# Patient Record
Sex: Male | Born: 1975
Health system: Southern US, Community
[De-identification: ages and names within clinical notes are randomized; demographics above are authoritative.]

## PROBLEM LIST (undated history)

## (undated) DIAGNOSIS — N189 Chronic kidney disease, unspecified: Secondary | ICD-10-CM

## (undated) DIAGNOSIS — T39395A Adverse effect of other nonsteroidal anti-inflammatory drugs [NSAID], initial encounter: Secondary | ICD-10-CM

## (undated) DIAGNOSIS — K2960 Other gastritis without bleeding: Secondary | ICD-10-CM

## (undated) DIAGNOSIS — M1990 Unspecified osteoarthritis, unspecified site: Secondary | ICD-10-CM

## (undated) DIAGNOSIS — K219 Gastro-esophageal reflux disease without esophagitis: Secondary | ICD-10-CM

## (undated) HISTORY — DX: Unspecified osteoarthritis, unspecified site: M19.90

## (undated) HISTORY — DX: Other gastritis without bleeding: K29.60

## (undated) HISTORY — DX: Chronic kidney disease, unspecified: N18.9

## (undated) HISTORY — DX: Gastro-esophageal reflux disease without esophagitis: K21.9

## (undated) HISTORY — DX: Adverse effect of other nonsteroidal anti-inflammatory drugs (NSAID), initial encounter: T39.395A

---

## 2000-03-16 ENCOUNTER — Emergency Department (HOSPITAL_COMMUNITY): Admission: EM | Admit: 2000-03-16 | Discharge: 2000-03-16 | Payer: Self-pay | Admitting: *Deleted

## 2000-03-16 ENCOUNTER — Encounter: Payer: Self-pay | Admitting: Emergency Medicine

## 2008-09-26 ENCOUNTER — Encounter: Admission: RE | Admit: 2008-09-26 | Discharge: 2008-09-26 | Payer: Self-pay | Admitting: Family Medicine

## 2010-04-06 HISTORY — PX: KNEE ARTHROSCOPY: SHX127

## 2011-05-09 ENCOUNTER — Ambulatory Visit (INDEPENDENT_AMBULATORY_CARE_PROVIDER_SITE_OTHER): Payer: BC Managed Care – PPO | Admitting: Family Medicine

## 2011-05-09 VITALS — BP 110/80 | HR 62 | Temp 98.5°F | Resp 16 | Ht 66.75 in | Wt 185.2 lb

## 2011-05-09 DIAGNOSIS — M545 Low back pain, unspecified: Secondary | ICD-10-CM

## 2011-05-09 DIAGNOSIS — K644 Residual hemorrhoidal skin tags: Secondary | ICD-10-CM

## 2011-05-09 MED ORDER — NAPROXEN 500 MG PO TABS: 500.0000 mg | ORAL_TABLET | Freq: Two times a day (BID) | ORAL | Status: DC

## 2011-05-09 MED ORDER — HYDROCORTISONE 2.5 % RE CREA: TOPICAL_CREAM | Freq: Two times a day (BID) | RECTAL | Status: AC

## 2011-05-09 MED ORDER — FLUTICASONE PROPIONATE 50 MCG/ACT NA SUSP: 2.0000 | Freq: Every day | NASAL | Status: DC

## 2011-05-09 NOTE — Progress Notes (Signed)
  Subjective:    Patient ID: Ryan Robles, male    DOB: 1975/12/25, 36 y.o.   MRN: [id number]  HPI  36 yo male with c/o 1) back pain- - low back pain, especially in the morning or carrying something heavy or bending over.  Off and on for months but worse last two weeks.  Advil helps.  No heat or ice.   2) hemorrhoids- history several years ago.  Having occassional pain.  Yesterday slight bleeding.  Constipation prior to symptoms.  A little diarrhea a few days ago.  Stopped.   Snores a lot also - congested.  Would like sometihng to help.  Bothers wife very much  Review of Systems    negative except as per HPI Objective:   Physical Exam  Constitutional: He appears well-developed and well-nourished.  HENT:  Nose: Mucosal edema present.  Genitourinary: Rectal exam shows external hemorrhoid.  Musculoskeletal: Normal range of motion.       Lumbar back: He exhibits no tenderness, no bony tenderness and no spasm.          Assessment & Plan:  Hemorrhoids - hydrocortisone cream.  Colace to help with BM's.  Handout given  LBP - muscular, mild - naproxen.  Snoring - try flonase.

## 2011-05-09 NOTE — Patient Instructions (Signed)
Hemorroides  (Hemorrhoids) Las hemorroides son venas agrandadas (dilatadas) alrededor del recto. Hay 2 tipos de hemorroides, que se determina por su ubicacin. Las hemorroides internas, que son las que se encuentran dentro del recto.Generalmente no duelen, Charity fundraiser.Sin embargo, pueden hincharse y salir por el recto, entonces se irritan y duelen. Las hemorroides externas incluyen las venas externas del ano, y se sienten como un bulto duro y que duele, cerca del ano.Muchas veces pican, y pueden romperse y Geophysicist/field seismologist. En algunos casos se forman cogulos en las venas. Esto hace que se hinchen y duelan. Esto se suele denominar trombosis hemorroidal.  CAUSAS  Las causas de hemorroides son:   Vanetta Mulders. El embarazo aumenta la presin en las venas hemorroidales.   Constipacin   Dificultad para mover el intestino   Obesidad.   Levantar pesas u otras actividades que impliquen esfuerzo.  TRATAMIENTO  La mayora de los casos de hemorroides mejoran en 1 a 2 semanas. Sin embargo, si los sntomas no mejoran o tiene Runner, broadcasting/film/video, el mdico puede Education officer, environmental un procedimiento para disminuir las hemorroides o extirparlas completamente.Los tratamientos posibles son:   Abigail Butts con Neomia Dear banda de goma. Se coloca una banda de goma en la base de la hemorroides para cortar la circulacin.   Escleroterapia Se inyecta una sustancia qumica para disminuir el tamao de la hemorroides.   Terapia con luz infrarroja. Se utiliza un instrumento para quemar la hemorroides.   Hemorroidectoma Es la remocin quirrgica de la hemorroides.  INSTRUCCIONES PARA EL CUIDADO EN EL HOGAR   Agregue fibra a su dieta. Consulte con su mdico acerca del uso de suplementos con fibras.   Beba gran cantidad de lquido para mantener la orina de tono claro o color amarillo plido.   Haga ejercicios regularmente.   Vaya al bao cuando sienta la necesidad de mover el intestino. Noespere.   Evite hacer fuerza al mover el  intestino.   Mantenga la zona anal limpia y seca.   Solo tome medicamentos que se pueden comprar sin receta o recetados para Chief Technology Officer, Dentist o fiebre, como le indica el mdico.  Si la hemorroides est trombosada:   Tome baos de asiento calientes durante 20 a 30 minutos, 3 a 4 veces por da.   Si le duele y est hinchada, coloque compresas con hielo en la zona, segn la tolerancia. Usar las compresas de [employer] baos de asiento puede ser Cresco. Llene una bolsa plstica con hielo. Coloque una toalla entre la bolsa de hielo y la piel.   Puede usar o Contractor segn las indicaciones algunas cremas especiales y supositorios.   No utilice una almohada en forma de aro ni se siente en el inodoro durante perodos prolongados. Esto aumenta la afluencia de sangre y Chief Technology Officer.  SOLICITE ATENCIN MDICA SI:   Aumenta el dolor y la hinchazn, y no puede controlarlo con Tourist information centre manager.   Tiene un sangrado que no puede parar.   No puede mover el intestino.   Siente dolor o tiene inflamacin fuera de la zona de las hemorroides.   Tiene escalofros o una temperatura oral mayor a 102 F (38.9 C).  ASEGRESE DE QUE:   Comprende estas instrucciones.   Controlar su enfermedad.   Solicitar ayuda de inmediato si no mejora o si empeora.  Document Released: 03/23/2005 Document Revised: 12/03/2010 [facility] Patient Information 2012 [place], [place].

## 2011-07-09 ENCOUNTER — Ambulatory Visit (INDEPENDENT_AMBULATORY_CARE_PROVIDER_SITE_OTHER): Payer: BC Managed Care – PPO | Admitting: Family Medicine

## 2011-07-09 VITALS — BP 142/68 | HR 104 | Temp 102.6°F | Resp 16 | Ht 67.0 in | Wt 178.0 lb

## 2011-07-09 DIAGNOSIS — R059 Cough, unspecified: Secondary | ICD-10-CM

## 2011-07-09 DIAGNOSIS — R05 Cough: Secondary | ICD-10-CM

## 2011-07-09 DIAGNOSIS — R509 Fever, unspecified: Secondary | ICD-10-CM

## 2011-07-09 LAB — POCT INFLUENZA A/B
Influenza A, POC: NEGATIVE
Influenza B, POC: NEGATIVE

## 2011-07-09 MED ORDER — IBUPROFEN 200 MG PO TABS
400.0000 mg | ORAL_TABLET | Freq: Once | ORAL | Status: AC
  Administered 2011-07-09: 400 mg via ORAL

## 2011-07-09 MED ORDER — NAPROXEN 500 MG PO TABS: 500.0000 mg | ORAL_TABLET | Freq: Two times a day (BID) | ORAL | Status: DC

## 2011-07-09 MED ORDER — HYDROCODONE-HOMATROPINE 5-1.5 MG/5ML PO SYRP: 5.0000 mL | ORAL_SOLUTION | Freq: Three times a day (TID) | ORAL | Status: AC | PRN

## 2011-07-09 NOTE — Progress Notes (Signed)
  Subjective:    Patient ID: Ryan Robles, male    DOB: December 07, 1975, 36 y.o.   MRN: [id number]  HPI 36 yo male with flu like symptoms for 2 days. Cough, sore throat, runny nose, bodyaches.  Couldn't stay at work.  Acutely worse today.  Daughter recently ill with similar symptoms.     Review of Systems Negative except as per HPI     Objective:   Physical Exam  Vitals reviewed. Constitutional: He appears well-developed. No distress.  HENT:  Right Ear: Tympanic membrane, external ear and ear canal normal. Tympanic membrane is not injected, not scarred, not perforated, not erythematous, not retracted and not bulging.  Left Ear: Tympanic membrane, external ear and ear canal normal. Tympanic membrane is not injected, not scarred, not perforated, not erythematous, not retracted and not bulging.  Nose: No mucosal edema or rhinorrhea. Right sinus exhibits no maxillary sinus tenderness and no frontal sinus tenderness. Left sinus exhibits no maxillary sinus tenderness and no frontal sinus tenderness.  Mouth/Throat: Uvula is midline, oropharynx is clear and moist and mucous membranes are normal. No oropharyngeal exudate or tonsillar abscesses.  Cardiovascular: Normal rate, regular rhythm, normal heart sounds and intact distal pulses.   No murmur heard. Pulmonary/Chest: Effort normal and breath sounds normal. No respiratory distress. He has no wheezes. He has no rales.  Lymphadenopathy:       Head (right side): No submandibular and no preauricular adenopathy present.       Head (left side): No submandibular and no preauricular adenopathy present.       Right cervical: No superficial cervical and no posterior cervical adenopathy present.      Left cervical: No superficial cervical and no posterior cervical adenopathy present.       Right: No supraclavicular adenopathy present.       Left: No supraclavicular adenopathy present.  Skin: Skin is warm and dry.   Frequent coughing fits.  Results for  orders placed in visit on 07/09/11  POCT INFLUENZA A/B      Component Value Range   Influenza A, POC Negative     Influenza B, POC Negative          Assessment & Plan:  Fever, cough - flu negative.  Naproxen and hycodan.  Rest, fluids.  OOW tomorrow.

## 2011-12-17 ENCOUNTER — Ambulatory Visit (INDEPENDENT_AMBULATORY_CARE_PROVIDER_SITE_OTHER): Payer: BC Managed Care – PPO | Admitting: Family Medicine

## 2011-12-17 VITALS — BP 116/78 | HR 60 | Temp 98.3°F | Resp 16 | Ht 66.5 in | Wt 171.0 lb

## 2011-12-17 DIAGNOSIS — Z23 Encounter for immunization: Secondary | ICD-10-CM

## 2011-12-17 DIAGNOSIS — Z0000 Encounter for general adult medical examination without abnormal findings: Secondary | ICD-10-CM

## 2011-12-17 LAB — COMPREHENSIVE METABOLIC PANEL
Albumin: 5.1 g/dL (ref 3.5–5.2)
CO2: 28 mEq/L (ref 19–32)
Glucose, Bld: 87 mg/dL (ref 70–99)
Potassium: 4.1 mEq/L (ref 3.5–5.3)
Sodium: 134 mEq/L — ABNORMAL LOW (ref 135–145)
Total Bilirubin: 1 mg/dL (ref 0.3–1.2)
Total Protein: 8 g/dL (ref 6.0–8.3)

## 2011-12-17 LAB — LIPID PANEL
Cholesterol: 184 mg/dL (ref 0–200)
Triglycerides: 95 mg/dL (ref <150)

## 2011-12-17 LAB — POCT CBC
HCT, POC: 47.8 % (ref 43.5–53.7)
Hemoglobin: 15 g/dL (ref 14.1–18.1)
Lymph, poc: 1.8 (ref 0.6–3.4)
MCHC: 31.4 g/dL — AB (ref 31.8–35.4)
POC Granulocyte: 5 (ref 2–6.9)

## 2011-12-17 NOTE — Patient Instructions (Addendum)
Fascitis plantar (Plantar Fascitis) La fascitis plantar es un trastorno frecuente que ocasiona dolor en el pie. Se trata de una inflamacin (irritacin) de la banda de tejidos fibrosos y resistentes que se extienden desde el hueso del taln (calcneo) hasta la parte anterior de la planta del pie. Esta inflamacin puede deberse a que ha permanecido Intel, a zapatos que no Holiday representative, a correr [employer], al sobrepeso, a un andar anormal y el uso excesivo del pie con dolor (esto es frecuente en los corredores). Tambin es frecuente [employer] que practican ejercicios aerbicos y Snowflake bailarines de ballet. SNTOMAS La persona que sufre fascitis plantar manifiesta:  Dolor intenso por la Assurant parte inferior del pie, especialmente al dar los primeros pasos luego de levantarse de la cama. Este dolor disminuye luego de caminar algunos minutos.   Dolor intenso al caminar [employer] de un tiempo prolongado de inactividad.   El dolor empeora al caminar descalzo o al subir escaleras.  DIAGNSTICO  El mdico har el diagnstico examinando sus pies.   En general no es necesario indicar radiografas.  PREVENCIN  Consulte a Nurse, mental health en medicina del deporte antes de comenzar un nuevo programa de ejercicios.   Los programas de caminatas ofrecen un buen entrenamiento. Hay una menor probabilidad de sufrir lesiones por uso excesivo, que son frecuentes [place] personas que corren. Hay menos impacto y menos agresin a las articulaciones.   Comience lentamente todo nuevo programa de ejercicios. Si aparece algn problema o dolor, disminuya la cantidad de tiempo o la distancia hasta que se encuentre cmodo.   Use calzado de buena calidad y reemplcelo regularmente.   Estire el pie y los ligamentos que se encuentran en la parte posterior del tobillo (tendn de [place]) antes y despus de Education officer, environmental actividad fsica.   Corra o practique ejercicios sobre  superficies parejas que no sean duras. Por ejemplo, el asfalto es mejor que el pavimento.   No corra descalzo sobre superficies duras.   Si camina sobre cinta, vare la inclinacin.   No siga con el entrenamiento si tiene problemas en el pie o en la articulacin. Busque ayuda profesional si no mejora.  INSTRUCCIONES PARA EL CUIDADO DOMICILIARIO  Evite las SUPERVAL[employer] INC causan dolor hasta que se recupere.   Use hielo o compresas fras sobre las zonas doloridas despus de Education officer, environmental ejercicios.   Only take over-the-counter or prescription medicines for pain, discomfort, or fever as directed by your caregiver.   Las plantillas blandas o las zapatillas con base de aire o gel pueden ser de [place].   Si los problemas continan o se agravan, consulte a Music therapist en medicina del deporte o con su mdico personal. La cortisona es un potente antiinflamatorio que puede inyectarse en la zona dolorida. El profesional que lo asiste comentar este tratamiento con usted.  EST SEGURO QUE:   Comprende las instrucciones para el alta mdica.   Controlar su enfermedad.   Solicitar atencin mdica de inmediato segn las indicaciones.  Document Released: 12/31/2004 Document Revised: 03/12/2011 [facility] Patient Information 2012 [place], [place].

## 2011-12-17 NOTE — Progress Notes (Signed)
[facility] [address] [place] [address] [phone]  Date:  12/17/2011   Name:  Ryan Robles   DOB:  1976-02-21   MRN:  [id number]  PCP:  No primary provider on file.    Chief Complaint: Annual Exam   History of Present Illness:  Ryan Robles is a 36 y.o. very pleasant male patient who presents with the following:  Here today for a CPE and biometric screening for his employer.  He is generally healthy.  Did have knee surgery (right knee scope) about a year ago and still has some pain with walking.    He also notes that when he works very hard he sometimes feels lightheaded and sweats a lot/ feels tired.    Fasting this morning.  He is unsure of the exact date of his last tetanus shot, but thinks he got it along with his knee surgery.   He would like to get a flu shot today as well  There is no problem list on file for this patient.   No past medical history on file.  No past surgical history on file.  History  Substance Use Topics  . Smoking status: Never Smoker   . Smokeless tobacco: Not on file  . Alcohol Use: Not on file    No family history on file.  No Known Allergies  Medication list has been reviewed and updated.  Current Outpatient Prescriptions on File Prior to Visit  Medication Sig Dispense Refill  . ibuprofen (ADVIL,MOTRIN) 200 MG tablet Take 200 mg by mouth 2 (two) times daily as needed.        Review of Systems:  As per HPI- otherwise negative.   Physical Examination: Filed Vitals:   12/17/11 0808  BP: 116/78  Pulse: 60  Temp: 98.3 F (36.8 C)  Resp: 16   Filed Vitals:   12/17/11 0808  Height: 5' 6.5" (1.689 m)  Weight: 171 lb (77.565 kg)   Body mass index is 27.19 kg/(m^2). Ideal Body Weight: Weight in (lb) to have BMI = 25: 156.9   GEN: WDWN, NAD, Non-toxic, A & O x 3 HEENT: Atraumatic, Normocephalic. Neck supple. No masses, No LAD.  TM and oropharynx wnl, PEERL, EOMI.   Ears and Nose: No external  deformity. CV: RRR, No M/G/R. No JVD. No thrill. No extra heart sounds. PULM: CTA B, no wheezes, crackles, rhonchi. No retractions. No resp. distress. No accessory muscle use. ABD: S, NT, ND, +BS. No rebound. No HSM. EXTR: No c/c/e NEURO Normal gait.  GU: normal exam PSYCH: Normally interactive. Conversant. Not depressed or anxious appearing.  Calm demeanor.   Results for orders placed in visit on 12/17/11  POCT CBC      Component Value Range   WBC 7.2  4.6 - 10.2 K/uL   Lymph, poc 1.8  0.6 - 3.4   POC LYMPH PERCENT 24.8  10 - 50 %L   MID (cbc) 0.5  0 - 0.9   POC MID % 6.3  0 - 12 %M   POC Granulocyte 5.0  2 - 6.9   Granulocyte percent 68.9  37 - 80 %G   RBC 5.03  4.69 - 6.13 M/uL   Hemoglobin 15.0  14.1 - 18.1 g/dL   HCT, POC 29.5  62.1 - 53.7 %   MCV 95.1  80 - 97 fL   MCH, POC 29.8  27 - 31.2 pg   MCHC 31.4 (*) 31.8 - 35.4 g/dL  RDW, POC 13.3     Platelet Count, POC 272  142 - 424 K/uL   MPV 8.3  0 - 99.8 fL    Assessment and Plan: 1. Physical exam, annual  POCT CBC, Comprehensive metabolic panel, Lipid panel, Flu vaccine greater than or equal to 3yo preservative free IM   Await other labs for further follow-up.  Went over his symptoms that he marked on his blue sheet.  It seems that they are related mostly to fatigue from his job.  Encouraged him to hydrate, eat regularly and rest when he needs to.  If these measures do not help please let me know.    Abbe Amsterdam, MD

## 2011-12-25 ENCOUNTER — Encounter: Payer: Self-pay | Admitting: Family Medicine

## 2011-12-29 ENCOUNTER — Ambulatory Visit (INDEPENDENT_AMBULATORY_CARE_PROVIDER_SITE_OTHER): Payer: BC Managed Care – PPO | Admitting: Family Medicine

## 2011-12-29 VITALS — BP 124/76 | HR 51 | Temp 98.3°F | Resp 16 | Ht 66.5 in | Wt 174.0 lb

## 2011-12-29 DIAGNOSIS — M79673 Pain in unspecified foot: Secondary | ICD-10-CM

## 2011-12-29 DIAGNOSIS — R5381 Other malaise: Secondary | ICD-10-CM

## 2011-12-29 DIAGNOSIS — R0609 Other forms of dyspnea: Secondary | ICD-10-CM

## 2011-12-29 DIAGNOSIS — M722 Plantar fascial fibromatosis: Secondary | ICD-10-CM

## 2011-12-29 DIAGNOSIS — R0989 Other specified symptoms and signs involving the circulatory and respiratory systems: Secondary | ICD-10-CM

## 2011-12-29 DIAGNOSIS — R5383 Other fatigue: Secondary | ICD-10-CM

## 2011-12-29 DIAGNOSIS — E785 Hyperlipidemia, unspecified: Secondary | ICD-10-CM

## 2011-12-29 DIAGNOSIS — R0683 Snoring: Secondary | ICD-10-CM

## 2011-12-29 DIAGNOSIS — M79609 Pain in unspecified limb: Secondary | ICD-10-CM

## 2011-12-29 NOTE — Patient Instructions (Addendum)
Your cholesterol was slightly elevated at last office visit. The sodium level was borderline low.  You can increase your exercise, fish oil supplement over the counter, less fried or fast food and recheck in the next 3 months.  We will refer you for a sleep study as snoring and fatigue can be caused by sleep apnea.  See the handout for plantar fasciitis and work on the exercises we discussed.

## 2011-12-29 NOTE — Progress Notes (Signed)
Subjective:    Patient ID: Ryan Robles, male    DOB: 15-Feb-1976, 36 y.o.   MRN: [id number]  HPI Ryan Robles is a 36 y.o. male Had physical 12/17/11 wants to review results.  Results for orders placed in visit on 12/17/11  POCT CBC      Component Value Range   WBC 7.2  4.6 - 10.2 K/uL   Lymph, poc 1.8  0.6 - 3.4   POC LYMPH PERCENT 24.8  10 - 50 %L   MID (cbc) 0.5  0 - 0.9   POC MID % 6.3  0 - 12 %M   POC Granulocyte 5.0  2 - 6.9   Granulocyte percent 68.9  37 - 80 %G   RBC 5.03  4.69 - 6.13 M/uL   Hemoglobin 15.0  14.1 - 18.1 g/dL   HCT, POC 40.9  81.1 - 53.7 %   MCV 95.1  80 - 97 fL   MCH, POC 29.8  27 - 31.2 pg   MCHC 31.4 (*) 31.8 - 35.4 g/dL   RDW, POC 91.4     Platelet Count, POC 272  142 - 424 K/uL   MPV 8.3  0 - 99.8 fL  COMPREHENSIVE METABOLIC PANEL      Component Value Range   Sodium 134 (*) 135 - 145 mEq/L   Potassium 4.1  3.5 - 5.3 mEq/L   Chloride 98  96 - 112 mEq/L   CO2 28  19 - 32 mEq/L   Glucose, Bld 87  70 - 99 mg/dL   BUN 27 (*) 6 - 23 mg/dL   Creat 7.82  9.56 - 2.13 mg/dL   Total Bilirubin 1.0  0.3 - 1.2 mg/dL   Alkaline Phosphatase 75  39 - 117 U/L   AST 25  0 - 37 U/L   ALT 20  0 - 53 U/L   Total Protein 8.0  6.0 - 8.3 g/dL   Albumin 5.1  3.5 - 5.2 g/dL   Calcium 9.8  8.4 - 08.6 mg/dL  LIPID PANEL      Component Value Range   Cholesterol 184  0 - 200 mg/dL   Triglycerides 95  <578 mg/dL   HDL 35 (*) >46 mg/dL   Total CHOL/HDL Ratio 5.3     VLDL 19  0 - 40 mg/dL   LDL Cholesterol 962 (*) 0 - 99 mg/dL     Foot pain - L more than right - past 2 weeks in L, 1 week on right.  New shoes at home for past 3 weeks. Has tried pad in bottom of shoe - helped some, but still some pain.  Tx: 2 advil qam, 2 pm, ice massage minimal improvement.  Worse in am with first stepping down.    Works for [employer] - on [employer] all day.    Also notes snoring - wife asked him to ask about this. Unknown if he has pauses, but does feel sleepy during the day.  Fatigue  noted for awhile. No hx of sleep apnea known, but no prior eval.   Review of Systems As above.       Objective:   Physical Exam  Constitutional: He is oriented to person, place, and time. He appears well-developed and well-nourished.  HENT:  Head: Normocephalic and atraumatic.  Eyes: EOM are normal. Pupils are equal, round, and reactive to light.  Pulmonary/Chest: Effort normal.  Musculoskeletal:       TTP bilateral plantar fascia, and distal  heel.  Skin intact.  Negative side squeeze.   Neurological: He is alert and oriented to person, place, and time.  Skin: Skin is warm and dry.  Psychiatric: He has a normal mood and affect. His behavior is normal.      Assessment & Plan:  Ryan Robles is a 36 y.o. male 1. Fatigue  Ambulatory referral to Sleep Studies  2. Snoring  Ambulatory referral to Sleep Studies  3. Foot pain    4. Plantar fasciitis, bilateral    5. Hyperlipidemia      Hyperlipidemia - borderline - discussed fish oil, diet changes, and recheck in 3 months. Borderline sodium - recheck in 3 months.  Fatigue with hx of snoring - will refer for sleep study.   Foot pain - suspected plantar fasciitis. Discussed tx options, and stretches.   Patient Instructions  Your cholesterol was slightly elevated at last office visit. The sodium level was borderline low.  You can increase your exercise, fish oil supplement over the counter, less fried or fast food and recheck in the next 3 months.  We will refer you for a sleep study as snoring and fatigue can be caused by sleep apnea.  See the handout for plantar fasciitis and work on the exercises we discussed.

## 2012-12-01 ENCOUNTER — Ambulatory Visit (INDEPENDENT_AMBULATORY_CARE_PROVIDER_SITE_OTHER): Payer: BC Managed Care – PPO | Admitting: Family Medicine

## 2012-12-01 VITALS — BP 118/72 | HR 56 | Temp 98.0°F | Resp 17 | Ht 66.5 in | Wt 172.0 lb

## 2012-12-01 DIAGNOSIS — Z0000 Encounter for general adult medical examination without abnormal findings: Secondary | ICD-10-CM

## 2012-12-01 LAB — POCT CBC
Granulocyte percent: 66.9 %G (ref 37–80)
MID (cbc): 0.3 (ref 0–0.9)
POC Granulocyte: 3.3 (ref 2–6.9)
POC LYMPH PERCENT: 27.9 %L (ref 10–50)
POC MID %: 5.2 %M (ref 0–12)
Platelet Count, POC: 204 10*3/uL (ref 142–424)
RDW, POC: 13.6 %

## 2012-12-01 NOTE — Progress Notes (Signed)
Subjective:    Patient ID: Ryan Robles, male    DOB: [birth year], 37 y.o.   MRN: [id number] Chief Complaint  Patient presents with  . Employment Physical    HPI  No complaints or concerns today.  Here for complete CPE and biometric screening form. Has not changed diet but overall trying to eat less and exercise more due to borderline chol last yr.  Has been drinking plenty of water. Is fasting today.  Past Medical History  Diagnosis Date  . GERD (gastroesophageal reflux disease)    Past Surgical History  Procedure Laterality Date  . Knee arthroscopy  2012    left, meniscal repair and ligament repair   Current Outpatient Prescriptions on File Prior to Visit  Medication Sig Dispense Refill  . ibuprofen (ADVIL,MOTRIN) 200 MG tablet Take 200 mg by mouth 2 (two) times daily as needed.       No current facility-administered medications on file prior to visit.   No Known Allergies History   Social History  . Marital Status: Married    Spouse Name: N/A    Number of Children: N/A  . Years of Education: N/A   Social History Main Topics  . Smoking status: Never Smoker   . Smokeless tobacco: Never Used  . Alcohol Use: No  . Drug Use: No  . Sexual Activity: Yes    Birth Control/ Protection: Condom   Other Topics Concern  . None   Social History Narrative  . None   Family History  Problem Relation Age of Onset  . Cancer Mother     Review of Systems  Constitutional: Positive for diaphoresis. Negative for fever, chills, activity change, appetite change and fatigue.  HENT: Negative for ear pain, congestion and sore throat.   Eyes: Negative for visual disturbance.  Respiratory: Negative for cough and shortness of breath.   Cardiovascular: Negative for chest pain and leg swelling.  Gastrointestinal: Negative for nausea, vomiting, abdominal pain, diarrhea and constipation.  Genitourinary: Negative for dysuria.  Musculoskeletal: Negative for myalgias, arthralgias and gait  problem.  Skin: Negative for rash.  Neurological: Negative for dizziness, weakness, numbness and headaches.  Psychiatric/Behavioral: Negative for sleep disturbance.  All other systems reviewed and are negative.      BP 118/72  Pulse 56  Temp(Src) 98 F (36.7 C) (Oral)  Resp 17  Ht 5' 6.5" (1.689 m)  Wt 172 lb (78.019 kg)  BMI 27.35 kg/m2  SpO2 99% Objective:   Physical Exam  Constitutional: He is oriented to person, place, and time. He appears well-developed and well-nourished. No distress.  HENT:  Head: Normocephalic and atraumatic.  Right Ear: Tympanic membrane, external ear and ear canal normal.  Left Ear: Tympanic membrane, external ear and ear canal normal.  Nose: Nose normal.  Mouth/Throat: Uvula is midline, oropharynx is clear and moist and mucous membranes are normal. No oropharyngeal exudate.  Eyes: Conjunctivae are normal. Right eye exhibits no discharge. Left eye exhibits no discharge. No scleral icterus.  Neck: Normal range of motion. Neck supple. No thyromegaly present.  Cardiovascular: Normal rate, regular rhythm, normal heart sounds and intact distal pulses.   Pulmonary/Chest: Effort normal and breath sounds normal. No respiratory distress.  Abdominal: Soft. Bowel sounds are normal. He exhibits no distension and no mass. There is no tenderness. There is no rebound and no guarding.  Musculoskeletal: He exhibits no edema.  Lymphadenopathy:    He has no cervical adenopathy.  Neurological: He is alert and oriented to person, place, and  time. He has normal reflexes. No cranial nerve deficit. He exhibits normal muscle tone.  Skin: Skin is warm and dry. No rash noted. He is not diaphoretic. No erythema.  Psychiatric: He has a normal mood and affect. His behavior is normal.      Assessment & Plan:  Routine general medical examination at a health care facility  No concerns - routine labs today - flp, cbc, cmp, tsh.  Biometric form completed and given to pt.

## 2012-12-01 NOTE — Patient Instructions (Signed)

## 2012-12-02 LAB — LIPID PANEL
LDL Cholesterol: 96 mg/dL (ref 0–99)
Total CHOL/HDL Ratio: 4.3 Ratio
Triglycerides: 96 mg/dL (ref <150)
VLDL: 19 mg/dL (ref 0–40)

## 2012-12-02 LAB — COMPREHENSIVE METABOLIC PANEL
AST: 16 U/L (ref 0–37)
Albumin: 4.5 g/dL (ref 3.5–5.2)
Alkaline Phosphatase: 59 U/L (ref 39–117)
Potassium: 3.9 mEq/L (ref 3.5–5.3)
Sodium: 138 mEq/L (ref 135–145)
Total Protein: 7.1 g/dL (ref 6.0–8.3)

## 2012-12-03 ENCOUNTER — Encounter: Payer: Self-pay | Admitting: Family Medicine

## 2013-12-28 ENCOUNTER — Ambulatory Visit (INDEPENDENT_AMBULATORY_CARE_PROVIDER_SITE_OTHER): Payer: BC Managed Care – PPO | Admitting: Family Medicine

## 2013-12-28 VITALS — BP 110/72 | HR 56 | Temp 98.1°F | Resp 16 | Ht 66.5 in | Wt 180.6 lb

## 2013-12-28 DIAGNOSIS — Z0000 Encounter for general adult medical examination without abnormal findings: Secondary | ICD-10-CM

## 2013-12-28 DIAGNOSIS — Z13220 Encounter for screening for lipoid disorders: Secondary | ICD-10-CM

## 2013-12-28 DIAGNOSIS — K625 Hemorrhage of anus and rectum: Secondary | ICD-10-CM

## 2013-12-28 DIAGNOSIS — Z130 Encounter for screening for diseases of the blood and blood-forming organs and certain disorders involving the immune mechanism: Secondary | ICD-10-CM

## 2013-12-28 DIAGNOSIS — Z131 Encounter for screening for diabetes mellitus: Secondary | ICD-10-CM

## 2013-12-28 DIAGNOSIS — Z23 Encounter for immunization: Secondary | ICD-10-CM

## 2013-12-28 LAB — POCT CBC
GRANULOCYTE PERCENT: 62.5 % (ref 37–80)
HEMATOCRIT: 46.7 % (ref 43.5–53.7)
HEMOGLOBIN: 15.6 g/dL (ref 14.1–18.1)
Lymph, poc: 2 (ref 0.6–3.4)
MCH: 31.5 pg — AB (ref 27–31.2)
MCHC: 33.5 g/dL (ref 31.8–35.4)
MCV: 93.9 fL (ref 80–97)
MID (CBC): 0.4 (ref 0–0.9)
MPV: 7.2 fL (ref 0–99.8)
PLATELET COUNT, POC: 208 10*3/uL (ref 142–424)
POC Granulocyte: 3.9 (ref 2–6.9)
POC LYMPH PERCENT: 31.2 %L (ref 10–50)
POC MID %: 6.3 %M (ref 0–12)
RBC: 4.97 M/uL (ref 4.69–6.13)
RDW, POC: 13.7 %
WBC: 6.3 10*3/uL (ref 4.6–10.2)

## 2013-12-28 LAB — LIPID PANEL
CHOL/HDL RATIO: 4.6 ratio
CHOLESTEROL: 183 mg/dL (ref 0–200)
HDL: 40 mg/dL (ref >39)
LDL Cholesterol: 132 mg/dL — ABNORMAL HIGH (ref 0–99)
Triglycerides: 57 mg/dL (ref <150)
VLDL: 11 mg/dL (ref 0–40)

## 2013-12-28 LAB — COMPREHENSIVE METABOLIC PANEL
ALT: 20 U/L (ref 0–53)
AST: 17 U/L (ref 0–37)
Albumin: 4.4 g/dL (ref 3.5–5.2)
Alkaline Phosphatase: 69 U/L (ref 39–117)
BUN: 17 mg/dL (ref 6–23)
CO2: 26 meq/L (ref 19–32)
CREATININE: 0.9 mg/dL (ref 0.50–1.35)
Calcium: 9.4 mg/dL (ref 8.4–10.5)
Chloride: 105 mEq/L (ref 96–112)
GLUCOSE: 96 mg/dL (ref 70–99)
POTASSIUM: 4.8 meq/L (ref 3.5–5.3)
Sodium: 137 mEq/L (ref 135–145)
Total Bilirubin: 0.6 mg/dL (ref 0.2–1.2)
Total Protein: 7.7 g/dL (ref 6.0–8.3)

## 2013-12-28 NOTE — Patient Instructions (Signed)
Si sangrede ano aparece una otra vez - regrese a English as a second language teacher. bebe agua durante dia y fibre en comida.  regrese hablar su rodialla y pies.  Keeping you healthy  Get these tests  Blood pressure- Have your blood pressure checked once a year by your healthcare provider.  Normal blood pressure is 120/80.  Weight- Have your body mass index (BMI) calculated to screen for obesity.  BMI is a measure of body fat based on height and weight. You can also calculate your own BMI at [email or web address].west-esparza.com/.  Cholesterol- Have your cholesterol checked regularly starting at age 40, sooner may be necessary if you have diabetes, high blood pressure, if a family member developed heart diseases at an early age or if you smoke.   Chlamydia, HIV, and other sexual transmitted disease- Get screened each year until the age of 69 then within three months of each new sexual partner.  Diabetes- Have your blood sugar checked regularly if you have high blood pressure, high cholesterol, a family history of diabetes or if you are overweight.  Get these vaccines  Flu shot- Every fall.  Tetanus shot- Every 10 years.  Menactra- Single dose; prevents meningitis.  Take these steps  Don't smoke- If you do smoke, ask your healthcare provider about quitting. For tips on how to quit, go to www.smokefree.gov or call 1-800-QUIT-NOW.  Be physically active- Exercise 5 days a week for at least 30 minutes.  If you are not already physically active start slow and gradually work up to 30 minutes of moderate physical activity.  Examples of moderate activity include walking briskly, mowing the yard, dancing, swimming bicycling, etc.  Eat a healthy diet- Eat a variety of healthy foods such as fruits, vegetables, low fat milk, low fat cheese, yogurt, lean meats, poultry, fish, beans, tofu, etc.  For more information on healthy eating, go to www.thenutritionsource.org  Drink alcohol in moderation- Limit alcohol intake two  drinks or less a day.  Never drink and drive.  Dentist- Brush and floss teeth twice daily; visit your dentis twice a year.  Depression-Your emotional health is as important as your physical health.  If you're feeling down, losing interest in things you normally enjoy please talk with your healthcare provider.  Gun Safety- If you keep a gun in your home, keep it unloaded and with the safety lock on.  Bullets should be stored separately.  Helmet use- Always wear a helmet when riding a motorcycle, bicycle, rollerblading or skateboarding.  Safe sex- If you may be exposed to a sexually transmitted infection, use a condom  Seat belts- Seat bels can save your life; always wear one.  Smoke/Carbon Monoxide detectors- These detectors need to be installed on the appropriate level of your home.  Replace batteries at least once a year.  Skin Cancer- When out in the sun, cover up and use sunscreen SPF 15 or higher.  Violence- If anyone is threatening or hurting you, please tell your healthcare provider.

## 2013-12-28 NOTE — Progress Notes (Signed)
Subjective:    Patient ID: Ryan Robles, male    DOB: 22-Sep-1975, 38 y.o.   MRN: [id number]  HPI [name] is a 38 y.o. male Here for complete physical and biometric screening for work.   Last cpe 11/2012. Hx of borderline cholesterol in 2013, but improved last year (LDL 130 down to 96). Fasting today. Takes fish oil on occasion.   Immunizations: Flu vaccine:  will get today. tdap: less than 10 years.  Dentist: February.  Optho/eye care eval: recommended. No glasses.  STI testing: declined.   Monogamous with wife.  Exercise - occasional, but sometimes knee pain on left - prior surgery.   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  There are no active problems to display for this patient.  Past Medical History  Diagnosis Date  . GERD (gastroesophageal reflux disease)    Past Surgical History  Procedure Laterality Date  . Knee arthroscopy  2012    left, meniscal repair and ligament repair   No Known Allergies Prior to Admission medications   Medication Sig Start Date End Date Taking? Authorizing Provider  ibuprofen (ADVIL,MOTRIN) 200 MG tablet Take 200 mg by mouth 2 (two) times daily as needed.   Yes Historical Provider, MD   History   Social History  . Marital Status: Married    Spouse Name: N/A    Number of Children: N/A  . Years of Education: N/A   Occupational History  . Not on file.   Social History Main Topics  . Smoking status: Never Smoker   . Smokeless tobacco: Never Used  . Alcohol Use: No  . Drug Use: No  . Sexual Activity: Yes    Birth Control/ Protection: Condom   Other Topics Concern  . Not on file   Social History Narrative  . No narrative on file      Review of Systems  Constitutional: Negative.   HENT: Negative.   Eyes: Negative.   Respiratory: Negative.   Cardiovascular: Negative.   Gastrointestinal: Positive for anal bleeding (occasional after BM.  thinks has hemorrhoids. ).  Endocrine: Negative.   Genitourinary: Negative.     Musculoskeletal: Positive for arthralgias (l knee - occasional - treated with advill ).  Skin: Negative.   Allergic/Immunologic: Negative.   Neurological: Negative.   Hematological: Negative.   Psychiatric/Behavioral: Negative.        Objective:   Physical Exam  Vitals reviewed. Constitutional: He is oriented to person, place, and time. He appears well-developed and well-nourished.  HENT:  Head: Normocephalic and atraumatic.  Right Ear: External ear normal.  Left Ear: External ear normal.  Mouth/Throat: Oropharynx is clear and moist.  Eyes: Conjunctivae and EOM are normal. Pupils are equal, round, and reactive to light.  Neck: Normal range of motion. Neck supple. No thyromegaly present.  Cardiovascular: Normal rate, regular rhythm, normal heart sounds and intact distal pulses.   Pulmonary/Chest: Effort normal and breath sounds normal. No respiratory distress. He has no wheezes.  Abdominal: Soft. He exhibits no distension. There is no tenderness.  Genitourinary: Rectal exam shows no external hemorrhoid, no fissure and anal tone normal.  Musculoskeletal: Normal range of motion. He exhibits no edema and no tenderness.  Crepitance in left knee.   Lymphadenopathy:    He has no cervical adenopathy.  Neurological: He is alert and oriented to person, place, and time. He has normal reflexes.  Skin: Skin is warm and dry.  Psychiatric: He has a normal mood and affect. His behavior is normal.  Filed Vitals:   12/28/13 0824  BP: 110/72  Pulse: 56  Temp: 98.1 F (36.7 C)  TempSrc: Oral  Resp: 16  Height: 5' 6.5" (1.689 m)  Weight: 180 lb 9.6 oz (81.92 kg)  SpO2: 98%    Visual Acuity Screening   Right eye Left eye Both eyes  Without correction: 20/13 20/15-1 20/13  With correction:      Body mass index is 28.72 kg/(m^2).  Results for orders placed in visit on 12/28/13  POCT CBC      Result Value Ref Range   WBC 6.3  4.6 - 10.2 K/uL   Lymph, poc 2.0  0.6 - 3.4   POC LYMPH  PERCENT 31.2  10 - 50 %L   MID (cbc) 0.4  0 - 0.9   POC MID % 6.3  0 - 12 %M   POC Granulocyte 3.9  2 - 6.9   Granulocyte percent 62.5  37 - 80 %G   RBC 4.97  4.69 - 6.13 M/uL   Hemoglobin 15.6  14.1 - 18.1 g/dL   HCT, POC 16.1  09.6 - 53.7 %   MCV 93.9  80 - 97 fL   MCH, POC 31.5 (*) 27 - 31.2 pg   MCHC 33.5  31.8 - 35.4 g/dL   RDW, POC 04.5     Platelet Count, POC 208  142 - 424 K/uL   MPV 7.2  0 - 99.8 fL       Assessment & Plan:  AXLE PARFAIT is a 38 y.o. male Annual physical exam  --anticipatory guidance as below in AVS, screening labs above. Health maintenance items as above in HPI discussed/recommended as applicable.   Screening for diabetes mellitus - Plan: Comprehensive metabolic panel  Screening for hyperlipidemia - Plan: Lipid panel  Screening for deficiency anemia - Plan: POCT CBC  Need for prophylactic vaccination and inoculation against influenza - Plan: Flu Vaccine QUAD 36+ mos IM  -flu vaccine given.   Rectal bleeding  -asx today.  Possible anal fissure vs hemorrhoid by history.  Discussed fiber, increase fluids and recheck if recurs.   Also noted intermittent L knee and foot pain - no acute sx's. rtc to discuss further if persists.   ppwk completed for work.   No orders of the defined types were placed in this encounter.   Patient Instructions  Si sangrede ano aparece una otra vez - regrese a English as a second language teacher. bebe agua durante dia y fibre en comida.  regrese hablar su rodialla y pies.  Keeping you healthy  Get these tests  Blood pressure- Have your blood pressure checked once a year by your healthcare provider.  Normal blood pressure is 120/80.  Weight- Have your body mass index (BMI) calculated to screen for obesity.  BMI is a measure of body fat based on height and weight. You can also calculate your own BMI at https://www.west-esparza.com/.  Cholesterol- Have your cholesterol checked regularly starting at age 55, sooner may be necessary if you  have diabetes, high blood pressure, if a family member developed heart diseases at an early age or if you smoke.   Chlamydia, HIV, and other sexual transmitted disease- Get screened each year until the age of 72 then within three months of each new sexual partner.  Diabetes- Have your blood sugar checked regularly if you have high blood pressure, high cholesterol, a family history of diabetes or if you are overweight.  Get these vaccines  Flu shot- Every fall.  Tetanus  shot- Every 10 years.  Menactra- Single dose; prevents meningitis.  Take these steps  Don't smoke- If you do smoke, ask your healthcare provider about quitting. For tips on how to quit, go to www.smokefree.gov or call 1-800-QUIT-NOW.  Be physically active- Exercise 5 days a week for at least 30 minutes.  If you are not already physically active start slow and gradually work up to 30 minutes of moderate physical activity.  Examples of moderate activity include walking briskly, mowing the yard, dancing, swimming bicycling, etc.  Eat a healthy diet- Eat a variety of healthy foods such as fruits, vegetables, low fat milk, low fat cheese, yogurt, lean meats, poultry, fish, beans, tofu, etc.  For more information on healthy eating, go to www.thenutritionsource.org  Drink alcohol in moderation- Limit alcohol intake two drinks or less a day.  Never drink and drive.  Dentist- Brush and floss teeth twice daily; visit your dentis twice a year.  Depression-Your emotional health is as important as your physical health.  If you're feeling down, losing interest in things you normally enjoy please talk with your healthcare provider.  Gun Safety- If you keep a gun in your home, keep it unloaded and with the safety lock on.  Bullets should be stored separately.  Helmet use- Always wear a helmet when riding a motorcycle, bicycle, rollerblading or skateboarding.  Safe sex- If you may be exposed to a sexually transmitted infection, use a  condom  Seat belts- Seat bels can save your life; always wear one.  Smoke/Carbon Monoxide detectors- These detectors need to be installed on the appropriate level of your home.  Replace batteries at least once a year.  Skin Cancer- When out in the sun, cover up and use sunscreen SPF 15 or higher.  Violence- If anyone is threatening or hurting you, please tell your healthcare provider.

## 2014-06-09 ENCOUNTER — Ambulatory Visit (INDEPENDENT_AMBULATORY_CARE_PROVIDER_SITE_OTHER): Payer: BLUE CROSS/BLUE SHIELD

## 2014-06-09 ENCOUNTER — Ambulatory Visit (INDEPENDENT_AMBULATORY_CARE_PROVIDER_SITE_OTHER): Payer: BLUE CROSS/BLUE SHIELD | Admitting: Family Medicine

## 2014-06-09 VITALS — BP 118/70 | HR 53 | Temp 97.9°F | Resp 18 | Ht 67.0 in | Wt 185.0 lb

## 2014-06-09 DIAGNOSIS — S93401A Sprain of unspecified ligament of right ankle, initial encounter: Secondary | ICD-10-CM

## 2014-06-09 DIAGNOSIS — M25571 Pain in right ankle and joints of right foot: Secondary | ICD-10-CM

## 2014-06-09 MED ORDER — NAPROXEN 500 MG PO TABS
500.0000 mg | ORAL_TABLET | Freq: Two times a day (BID) | ORAL | Status: DC
Start: 2014-06-09 — End: 2014-11-09

## 2014-06-09 NOTE — Patient Instructions (Signed)
Wear Swede-O splint  Recommend wearing lace up  Boots  Take naproxen 500 mg twice daily for pain and inflammation  B very cautious to avoid reinjury  Return sooner if problems

## 2014-06-09 NOTE — Progress Notes (Signed)
Subjective: 39 year old male who turned his right ankle about 2 weeks ago. He is continued to live with it, feeling like most things like this should just resolve. It has continued to hurt when he turns his ankle the wrong way or bears weight hard on it. No history of fractures.  Objective: Calf is nontender though he says the pain hurts up toward his knee sometimes. Good range of motion of the ankle. However on doing a inversion dorsiflexion motion he gets a sudden twinge of severe pain to the ankle. When he was walking back and forth in the room, it would hurt when he said it down just a certain angle. It hurts deep and medial to the lateral malleolus  Assessment: Right ankles range and pain  Plan: X-ray ankle  [facility] reading (PRIMARY) by  Dr. Alwyn RenHopper Normal ankle  Swede-O splint  Return if not significantly improved over next 2 weeks or so..Marland Kitchen

## 2014-11-05 DIAGNOSIS — K219 Gastro-esophageal reflux disease without esophagitis: Secondary | ICD-10-CM

## 2014-11-05 HISTORY — DX: Gastro-esophageal reflux disease without esophagitis: K21.9

## 2014-11-09 ENCOUNTER — Ambulatory Visit (INDEPENDENT_AMBULATORY_CARE_PROVIDER_SITE_OTHER): Payer: BLUE CROSS/BLUE SHIELD | Admitting: Urgent Care

## 2014-11-09 VITALS — BP 108/68 | HR 63 | Temp 98.5°F | Resp 16 | Ht 68.0 in | Wt 193.0 lb

## 2014-11-09 DIAGNOSIS — K2960 Other gastritis without bleeding: Secondary | ICD-10-CM

## 2014-11-09 DIAGNOSIS — R1084 Generalized abdominal pain: Secondary | ICD-10-CM | POA: Insufficient documentation

## 2014-11-09 DIAGNOSIS — M25571 Pain in right ankle and joints of right foot: Secondary | ICD-10-CM | POA: Diagnosis not present

## 2014-11-09 DIAGNOSIS — T39395A Adverse effect of other nonsteroidal anti-inflammatory drugs [NSAID], initial encounter: Secondary | ICD-10-CM

## 2014-11-09 DIAGNOSIS — T3991XA Poisoning by unspecified nonopioid analgesic, antipyretic and antirheumatic, accidental (unintentional), initial encounter: Secondary | ICD-10-CM

## 2014-11-09 DIAGNOSIS — M19079 Primary osteoarthritis, unspecified ankle and foot: Secondary | ICD-10-CM | POA: Insufficient documentation

## 2014-11-09 DIAGNOSIS — M19071 Primary osteoarthritis, right ankle and foot: Secondary | ICD-10-CM | POA: Diagnosis not present

## 2014-11-09 HISTORY — DX: Adverse effect of other nonsteroidal anti-inflammatory drugs (NSAID), initial encounter: T39.395A

## 2014-11-09 HISTORY — DX: Adverse effect of other nonsteroidal anti-inflammatory drugs (NSAID), initial encounter: K29.60

## 2014-11-09 LAB — COMPREHENSIVE METABOLIC PANEL
ALBUMIN: 4 g/dL (ref 3.6–5.1)
ALK PHOS: 55 U/L (ref 40–115)
ALT: 38 U/L (ref 9–46)
AST: 40 U/L (ref 10–40)
BUN: 14 mg/dL (ref 7–25)
CO2: 23 mmol/L (ref 20–31)
CREATININE: 0.78 mg/dL (ref 0.60–1.35)
Calcium: 9.6 mg/dL (ref 8.6–10.3)
Chloride: 104 mmol/L (ref 98–110)
GLUCOSE: 86 mg/dL (ref 65–99)
POTASSIUM: 4.4 mmol/L (ref 3.5–5.3)
Sodium: 141 mmol/L (ref 135–146)
TOTAL PROTEIN: 6.6 g/dL (ref 6.1–8.1)
Total Bilirubin: 0.5 mg/dL (ref 0.2–1.2)

## 2014-11-09 MED ORDER — DICLOFENAC SODIUM 1 % TD GEL: TRANSDERMAL | Status: DC

## 2014-11-09 MED ORDER — FAMOTIDINE 20 MG PO TABS: 20.0000 mg | ORAL_TABLET | Freq: Two times a day (BID) | ORAL | Status: DC

## 2014-11-09 MED ORDER — ESOMEPRAZOLE MAGNESIUM 40 MG PO CPDR: 40.0000 mg | DELAYED_RELEASE_CAPSULE | Freq: Every day | ORAL | Status: DC

## 2014-11-09 NOTE — Progress Notes (Signed)
    MRN: [id number] DOB: 04/10/1975  Subjective:   Ryan Robles is a 39 y.o. male presenting for chief complaint of Abdominal Pain; Fatigue; and Foot Pain  HPI collected in Spanish.  Abdominal pain - reports 2 week history of mid abdominal pain, is dull generally but last night was constant and sharp, pain is sometimes associated with food. Has tried alka-seltzer and Tums without any relief. Of note, patient admits that he takes multiple ibuprofen daily for joint pain, goes through a bottle of 200 pills very quickly. He works very physically demanding jobs, [employer] worker, international aid (machinery) and lawn care, has knee and ankle pain. Denies fever, n/v, diarrhea, bloody stools, chest pain, shob, cough, sore throat. Patient drinks ~12 beers in a day over the weekends.   Foot pain - reports ~6 months of right ankle pain. Has previously been seen for this, imaging done but had difficulty understanding results due to language barrier. Today, he admits intermittent achy sensation over his anterior ankle. Denies swelling, bony deformity, bruising; posterior, lateral or medial pain, redness, toe pain, tarsal pain, numbness and tingling. Admits history of arthritis in his right knee.  Denies any other aggravating or relieving factors, no other questions or concerns.  [name] has a current medication list which includes the following prescription(s): ibuprofen. He has No Known Allergies.  [name]  has a past medical history of GERD (gastroesophageal reflux disease). Also  has past surgical history that includes Knee arthroscopy (2012).  ROS As in subjective.  Objective:   Vitals: BP 108/68 mmHg  Pulse 63  Temp(Src) 98.5 F (36.9 C)  Resp 16  Ht  (1.727 m)  Wt 193 lb (87.544 kg)  BMI 29.35 kg/m2  SpO2 98%  Physical Exam  Constitutional: He is oriented to person, place, and time. He appears well-developed and well-nourished.  HENT:  Mouth/Throat: Oropharynx is clear and moist.  Cardiovascular: Normal  rate, regular rhythm and intact distal pulses.  Exam reveals no gallop and no friction rub.   No murmur heard. Pulmonary/Chest: Effort normal. No respiratory distress. He has no wheezes. He has no rales.  Abdominal: Soft. Bowel sounds are normal. He exhibits no distension and no mass. There is tenderness (generalized throughout).  Musculoskeletal:       Right ankle: He exhibits normal range of motion, no swelling, no ecchymosis, no deformity and no laceration. Tenderness (Over area depicted). Achilles tendon exhibits no pain and normal Thompson's test results.       Feet:  Neurological: He is alert and oriented to person, place, and time.  Skin: Skin is warm and dry. No rash noted. No erythema. No pallor.   Assessment and Plan :   1. NSAID induced gastritis 2. Generalized abdominal pain - Stop using ibuprofen, switch to topical NSAID. H. Pylori test pending, will start PPI treatment with Pepcid AC for short-term relief, rtc if symptoms worsens in 1-2 weeks - Comprehensive metabolic panel - H. pylori breath test  3. Osteoarthritis of ankle or foot, right 4. Right ankle pain - Reviewed his imaging results from his right ankle from 06/2014, which showed osteoarthritis of his ankle.  - Will start topical diclofenac, modification of activities - Return in 2 weeks if no improvement, consider referral to physical therapy or orthopedics  Wallis Bamberg, PA-C [facility] [phone] 11/09/2014 8:17 AM

## 2014-11-09 NOTE — Patient Instructions (Addendum)
Osteoartritis (Osteoarthritis) La osteoartritis es una enfermedad que provoca dolor e inflamacin en las articulaciones. Ocurre cuando el cartlago de la articulacin afectada se desgasta. El cartlago acta como una almohadilla que cubre los extremos de los huesos que forman una articulacin. La osteoartritis es la ms frecuente de reumatismo articular. Afecta a menudo a los ancianos. Las articulaciones que se ven ms afectadas por esta afeccin son las que se encuentran en las siguientes zonas:  Los extremos de los dedos.  Los pulgares.  El cuello.  La parte inferior de la espalda.  Las rodillas.  Las caderas CAUSAS  Con el paso del [place], el cartlago que recubre los extremos de los huesos comienza a IT sales professional. Esto provoca friccin [employer], lo que causa dolor y entumecimiento en las articulaciones afectadas.  [place] probabilidades de padecer osteoartritis, incluidos los siguientes:  Edad avanzada.  Exceso de Engineer, site.  Uso excesivo de la articulacin. [place] y entumecimiento en la articulacin.  Con el tiempo, la articulacin pierde su forma normal.  Pueden formarse pequeos depsitos de hueso (ostefitos) en los extremos de Water engineer.  Algunos trozos de [employer] o cartlago pueden separarse y flotar dentro del espacio de la articulacin. Esto puede causar ms dolor y lesiones. DIAGNSTICO  El mdico le preguntar acerca de sus sntomas y le har un examen fsico. Le indicarn varios estudios, como:  Radiografas de Counselling psychologist.  Una resonancia magntica (RM).  Anlisis de sangre para descartar otros tipos de artritis.  Anlisis de los fluidos de Water engineer. Para ello se utiliza una aguja para extraer lquido de la articulacin y examinarlo en el microscopio. TRATAMIENTO  Los [employer] del tratamiento son Financial controller y mejorar el funcionamiento  de Water engineer. Los planes de tratamiento pueden incluir lo siguiente:  Un programa de ejercicios recomendado que permita el descanso y el alivio de la articulacin.  Un plan de control del peso.  Tcnicas de [employer], como las siguientes:  Aplicacin correcta de fro y Freight forwarder.  Impulsos elctricos enviados a las terminaciones nerviosas que se encuentran debajo de la piel (neuroestimulacin elctrica transcutnea [TENS, por sus siglas en ingls]).  Masajes.  Ciertos suplementos nutricionales.  Medicamentos para Financial controller como:  Paracetamol.  Antiinflamatorios no esteroides (AINE), como el naproxeno.  Narcticos o agentes de accin central, como el tramadol.  Corticoides. Estos se pueden administrar por va oral o mediante una inyeccin.  Ciruga para reposicionar los [employer] Services y Best boy (osteotoma) o para retirar las piezas sueltas de hueso y Database administrator. Puede ser necesario el reemplazo de las articulaciones en estadios avanzados de la enfermedad. [place] los medicamentos solamente como se lo haya indicado el mdico.  Mantenga un peso saludable. Siga las instrucciones del mdico con respecto al control del [place]. Esto puede incluir instrucciones Recruitment consultant.  Practique los ejercicios que le indiquen. Es posible que el mdico le recomiende tipos especficos de ejercicios. Estos pueden incluir:  Ejercicios de fortalecimiento Se realizan para fortalecer los [employer] sostienen las articulaciones afectadas por la artritis. Pueden realizarse con peso o con bandas para agregar resistencia.  Actividades Precious Haws. Son Clinical research associate a paso ligero, gimnasia [place] de bajo impacto, que acelere el corazn.  Actividades de amplitud de movimientos. Dan agilidad a las articulaciones.  Ejercicios de equilibrio y [place]. Ayudan a [employer] se necesitan para  la vida diaria.  Haga descansar  a las articulaciones segn las indicaciones del mdico.  Concurra a todas las visitas de control como se lo haya indicado el mdico. SOLICITE ATENCIN MDICA SI:   La piel se pone roja.  Aparece una erupcin adems del dolor en la articulacin.  El dolor en la articulacin empeora.  Tiene fiebre y siente dolor en la articulacin o el msculo. SOLICITE ATENCIN MDICA DE INMEDIATO SI:  Nota una prdida importante de peso o del apetito.  Tiene transpiracin nocturna. PARA Parthenia Ames MS INFORMACIN   [employer] de Artritis y Event organiser Musculoesquelticas y Dermatolgicas [facility] of Arthritis and Musculoskeletal and Skin Diseases): www.niams.http://www.myers.net/.  Instituto [employer] el Envejecimiento ([employer] on Aging): https://walker.com/.  Instituto Norteamericano de Advice worker of Rheumatology): www.rheumatology.org. Document Released: 12/31/2004 Document Revised: 08/07/2013 ExitCare Patient Information 2015 [place], [place]. This information is not intended to replace advice given to you by your health care provider. Make sure you discuss any questions you have with your health care provider.   lcera pptica  (Peptic Ulcer)  La lcera pptica es una llaga dolorosa en la membrana que recubre el esfago (lcera esofgica), el estmago (lcera gstrica), o la primera parte del intestino delgado (lcera duodenal). La lcera causa erosin en los tejidos profundos.  CAUSAS  Normalmente, el revestimiento del estmago y del intestino delgado se protegen a s mismos del cido con que se digieren los alimentos. El revestimiento protector puede daarse debido a:   Una infeccin causada por una bacteria llamada Helicobacter pylori (H. pylori).  El uso regular de medicamentos anti-inflamatorios no esteroides (AINE), como el ibuprofeno o la aspirina.  El consumo de tabaco. Otros factores de riesgo incluyen ser mayor de 50 aos, el consumo de alcohol en  exceso y [place] Finlay antecedentes familiares de lcera.  SNTOMAS   Dolor quemante o punzante en la zona entre el pecho y el ombligo.  Acidez.  Nuseas y vmitos.  Hinchazn. El dolor empeora con el estmago vaco y por la noche. Si la lcera sangra, puede causar:   Materia fecal de color negro alquitranado.  Vmito de sangre roja brillante.  Vmito de aspecto similar a la borra del caf. DIAGNSTICO  El diagnstico se realiza basndose en la historia clnica y el examen fsico. Para encontrar las causas de la lcera podrn indicarle otros estudios y procedimientos. Encontrar la causa ayudar a Futures trader. Los estudios y procedimientos pueden incluir:   Anlisis de sangre, anlisis de materia fecal, o estudios del aliento para Engineer, manufacturing la bacteria H. pylori.  Una seriada del tracto gastrointestinal (GI) del esfago, el estmago y el intestino delgado.  Una endoscopia para examinar el esfago, el estmago y el intestino delgado.  Una biopsia. TRATAMIENTO  El tratamiento incluye:   La eliminacin de la causa de la lcera, como el tabaquismo, los [place] o el alcohol.  Medicamentos para reducir la cantidad de cido en el tracto digestivo.  Antibiticos si la causa de la lcera es la bacteria H. pylori.  Una endoscopia superior para tratar Rolan [place] sangrante.  Ciruga si el sangrado es grave o si la lcera ha perforado Event organiser en el sistema digestivo. INSTRUCCIONES PARA EL CUIDADO EN EL HOGAR   Evite el tabaco, el alcohol y la cafena. El fumar puede aumentar el cido en el estmago y el tabaquismo continuado no favorecer la curacin de las lceras.  Evite los alimentos y las bebidas que le parece que le causan molestias o que le  agravan su lcera.  Tome slo la medicacin que le indic el profesional. No tome sustitutos de venta libre de los medicamentos recetados sin Science writer a su mdico.  [place] con las consultas de control y hgase los estudios segn  las indicaciones. SOLICITE ATENCIN MDICA SI:   La infeccin no mejora dentro de los [address]  [address] despus de Programmer, systems.  Siente indigestin o [place] continua. SOLICITE ATENCIN MDICA DE INMEDIATO SI:   Siente un dolor repentino y agudo o persistente en el abdomen.  La materia fecal es sanguinolenta o negra, de aspecto alquitranado.  Vomita sangre o el vmito tiene el aspecto similar a la borra del caf.  Si se siente mareado, dbil o que va a desmayarse.  Se siente transpirado o sudoroso. ASEGRESE DE QUE:   Comprende estas instrucciones.  Controlar su enfermedad.  Solicitar ayuda de inmediato si no mejora o si empeora. Document Released: 12/31/2004 Document Revised: 12/16/2011 [facility] Patient Information 2015 [place], [place]. This information is not intended to replace advice given to you by your health care provider. Make sure you discuss any questions you have with your health care provider.

## 2014-11-12 ENCOUNTER — Encounter: Payer: Self-pay | Admitting: Urgent Care

## 2014-11-12 ENCOUNTER — Telehealth: Payer: Self-pay

## 2014-11-12 ENCOUNTER — Telehealth: Payer: Self-pay | Admitting: Urgent Care

## 2014-11-12 DIAGNOSIS — A048 Other specified bacterial intestinal infections: Secondary | ICD-10-CM

## 2014-11-12 LAB — H. PYLORI BREATH TEST: H. PYLORI BREATH TEST: DETECTED — AB

## 2014-11-12 MED ORDER — AMOXICILLIN 500 MG PO TABS: 1000.0000 mg | ORAL_TABLET | Freq: Two times a day (BID) | ORAL | Status: DC

## 2014-11-12 MED ORDER — CLARITHROMYCIN 500 MG PO TABS: 500.0000 mg | ORAL_TABLET | Freq: Two times a day (BID) | ORAL | Status: DC

## 2014-11-12 NOTE — Telephone Encounter (Signed)
Called patient. We will start eradication therapy of H. Pylori. Patient is to continue PPI with this as well.

## 2014-11-12 NOTE — Telephone Encounter (Signed)
PA completed for diclofenac gel on covermymeds. Pending. 

## 2014-11-13 ENCOUNTER — Telehealth: Payer: Self-pay

## 2014-11-13 NOTE — Telephone Encounter (Signed)
Patient has questions in regards to the medications he has been prescribed, Patient not sure if he was suppose to continue taking all of them or stop some of them. Patients call back number is [phone]

## 2014-11-14 NOTE — Telephone Encounter (Signed)
I spoke with him directly yesterday. I told him he is supposed to continue Nexium plus the 2 antibiotics prescribed. I'm sorry you're having to repeat this to him again. I went over it multiple times with him. Maybe you can suggest he write it down this time. Thank you, Delaney Meigs!

## 2014-11-14 NOTE — Telephone Encounter (Signed)
Pt notified. I believe he understood.

## 2014-11-14 NOTE — Telephone Encounter (Signed)
Positive for H. Pylori please advise.

## 2014-11-15 NOTE — Telephone Encounter (Signed)
PA approved through 04/05/2038. Notified pharm. 

## 2015-01-12 ENCOUNTER — Ambulatory Visit (INDEPENDENT_AMBULATORY_CARE_PROVIDER_SITE_OTHER): Payer: BLUE CROSS/BLUE SHIELD | Admitting: Family Medicine

## 2015-01-12 ENCOUNTER — Other Ambulatory Visit: Payer: Self-pay | Admitting: Family Medicine

## 2015-01-12 VITALS — BP 102/68 | HR 58 | Temp 98.2°F | Resp 18 | Ht 67.0 in | Wt 185.4 lb

## 2015-01-12 DIAGNOSIS — Z114 Encounter for screening for human immunodeficiency virus [HIV]: Secondary | ICD-10-CM | POA: Diagnosis not present

## 2015-01-12 DIAGNOSIS — Z13220 Encounter for screening for lipoid disorders: Secondary | ICD-10-CM | POA: Diagnosis not present

## 2015-01-12 DIAGNOSIS — Z23 Encounter for immunization: Secondary | ICD-10-CM

## 2015-01-12 DIAGNOSIS — Z0000 Encounter for general adult medical examination without abnormal findings: Secondary | ICD-10-CM

## 2015-01-12 DIAGNOSIS — R7989 Other specified abnormal findings of blood chemistry: Secondary | ICD-10-CM

## 2015-01-12 DIAGNOSIS — Z131 Encounter for screening for diabetes mellitus: Secondary | ICD-10-CM | POA: Diagnosis not present

## 2015-01-12 DIAGNOSIS — M25571 Pain in right ankle and joints of right foot: Secondary | ICD-10-CM

## 2015-01-12 DIAGNOSIS — Z1329 Encounter for screening for other suspected endocrine disorder: Secondary | ICD-10-CM | POA: Diagnosis not present

## 2015-01-12 DIAGNOSIS — R945 Abnormal results of liver function studies: Secondary | ICD-10-CM

## 2015-01-12 LAB — POCT URINALYSIS DIP (MANUAL ENTRY)
Bilirubin, UA: NEGATIVE
Blood, UA: NEGATIVE
Glucose, UA: NEGATIVE
Ketones, POC UA: NEGATIVE
LEUKOCYTES UA: NEGATIVE
NITRITE UA: NEGATIVE
PH UA: 6
PROTEIN UA: NEGATIVE
Spec Grav, UA: <=1.005
UROBILINOGEN UA: 0.2

## 2015-01-12 NOTE — Patient Instructions (Signed)

## 2015-01-12 NOTE — Progress Notes (Signed)
Subjective:  This chart was scribed for Ryan Simmer, MD by Andrew Au, ED Scribe. This patient was seen in room 3 and the patient's care was started at 2:59 PM.   Patient ID: Ryan Robles, male    DOB: 27-Feb-1976, 39 y.o.   MRN: [id number]  HPI Chief Complaint  Patient presents with   Annual Exam    CPE   HPI Comments: Ryan Robles is a 39 y.o. male who presents to the [facility] for a physical.   His last physical was 1 year.  Has not had colonoscopy.  Immunizations TDAP- about 06/2011. Around the same time has knee surgery. Agrees to flu shot today.   Immunization History  Administered Date(s) Administered   Influenza Split 12/17/2011   Influenza,inj,Quad PF,36+ Mos 12/28/2013, 01/12/2015   Td 06/05/2011    Eye Care Has not been seen by optho. Denies wearing glasses or contacts   Visual Acuity Screening   Right eye Left eye Both eyes  Without correction: 20/13 20/13 20/13   With correction:      Dentist  He is seen by dentist regularly an an appointment with Dentist.  Family hx  His mother is 66 and is unsure of her medical hx. His father is 18 without health problems. He has 2 sisters and 3 brothers wihtout medical problems.  Social hx Pt has been married for 1 years. He has 3 children, a 56 y.o 46 y.o and 62 y.o. Pt works at [employer] and has worked there for 11 years. He is from [place] and moved to [place] in 2000. He denies smoking and alcohol. He is very active with work and often does yard work after work.  Goes to bed at 10pm, wakes up at 6pm. Does not wake up often during the night. Wakes up occasionally during the night to urinate. He reports his urine stream is weak often , but does not know when this problem started.  He denies depression and anxiety.  He denies penile pain, discharge, and trouble with erections   Ankle pain  Pt was seen 06/2014 by Dr. Alwyn Ren for ankle pain. He had had Xray which showed Tibial talar arthritis.  He is still having problems with his right ankle, especially with working and at night. He was prescribed topical Diclofenac but Mani, which he found helpful with pain. He also takes tylenol for pain. He denies swelling in right ankle.   He denies neck pain, shoulder pain, back pain, tinnitus, blurred vision, dizziness, CP, palpitation, SOB, difficulty breathing, cough sneezing, diarrhea, heart burn, indigestion, abdominal pain.   Past Medical History  Diagnosis Date   GERD (gastroesophageal reflux disease)    No Known Allergies Prior to Admission medications   Medication Sig Start Date End Date Taking? Authorizing Provider  amoxicillin (AMOXIL) 500 MG tablet Take 2 tablets (1,000 mg total) by mouth 2 (two) times daily. Patient not taking: Reported on 01/12/2015 11/12/14   Wallis Bamberg, PA-C  clarithromycin (BIAXIN) 500 MG tablet Take 1 tablet (500 mg total) by mouth 2 (two) times daily. Patient not taking: Reported on 01/12/2015 11/12/14   Wallis Bamberg, PA-C  diclofenac sodium (VOLTAREN) 1 % GEL 1 application daily for joint pain. Patient not taking: Reported on 01/12/2015 11/09/14   Wallis Bamberg, PA-C  esomeprazole (NEXIUM) 40 MG capsule Take 1 capsule (40 mg total) by mouth daily at 12 noon. Patient not taking: Reported on 01/12/2015 11/09/14   Wallis Bamberg, PA-C  famotidine (PEPCID) 20  MG tablet Take 1 tablet (20 mg total) by mouth 2 (two) times daily. Patient not taking: Reported on 01/12/2015 11/09/14   Wallis Bamberg, PA-C   Review of Systems  HENT: Negative for tinnitus.   Eyes: Negative for visual disturbance.  Respiratory: Negative for apnea, cough, shortness of breath and wheezing.   Cardiovascular: Negative for chest pain, palpitations and leg swelling.  Gastrointestinal: Negative for nausea, abdominal pain, diarrhea and constipation.  Genitourinary: Positive for difficulty urinating. Negative for discharge and penile pain.  Musculoskeletal: Positive for arthralgias. Negative for myalgias, back pain  and neck pain.   Objective:   Physical Exam  Constitutional: He is oriented to person, place, and time. He appears well-developed and well-nourished. No distress.  HENT:  Head: Normocephalic and atraumatic.  Right Ear: External ear normal.  Left Ear: External ear normal.  Nose: Nose normal.  Mouth/Throat: Oropharynx is clear and moist. No oropharyngeal exudate.  Eyes: Conjunctivae and EOM are normal. Pupils are equal, round, and reactive to light.  Neck: Normal range of motion. Neck supple. Carotid bruit is not present. No thyromegaly present.  Cardiovascular: Normal rate, regular rhythm, normal heart sounds and intact distal pulses.  Exam reveals no gallop and no friction rub.   No murmur heard. Pulmonary/Chest: Effort normal and breath sounds normal. No respiratory distress. He has no wheezes. He has no rales. He exhibits no tenderness.  Abdominal: Soft. Bowel sounds are normal. He exhibits no distension and no mass. There is no tenderness. There is no rebound and no guarding. Hernia confirmed negative in the right inguinal area and confirmed negative in the left inguinal area.  Genitourinary: Testes normal and penis normal. Right testis shows no mass and no tenderness. Left testis shows no mass and no tenderness.  Musculoskeletal: Normal range of motion.       Right shoulder: Normal.       Left shoulder: Normal.       Cervical back: Normal.  Lymphadenopathy:    He has no cervical adenopathy.  Neurological: He is alert and oriented to person, place, and time. He has normal reflexes. No cranial nerve deficit. He exhibits normal muscle tone. Coordination normal.  Skin: Skin is warm and dry. No rash noted. He is not diaphoretic.  Psychiatric: He has a normal mood and affect. His behavior is normal. Judgment and thought content normal.  Nursing note and vitals reviewed.  Filed Vitals:   01/12/15 1446  BP: 102/68  Pulse: 58  Temp: 98.2 F (36.8 C)  TempSrc: Oral  Resp: 18  Height:   (1.702 m)  Weight: 185 lb 6.4 oz (84.097 kg)  SpO2: 98%  ; Assessment & Plan:    1. Routine physical examination   2. Screening, lipid   3. Screening for diabetes mellitus   4. Screening for HIV (human immunodeficiency virus)   5. Need for prophylactic vaccination and inoculation against influenza   6. Screening for thyroid disorder   7. Right ankle pain     Orders Placed This Encounter  Procedures   Flu Vaccine QUAD 36+ mos IM   CBC with Differential/Platelet   Comprehensive metabolic panel    Order Specific Question:  Has the patient fasted?    Answer:  Yes   Hemoglobin A1c   HIV antibody   Lipid panel    Order Specific Question:  Has the patient fasted?    Answer:  Yes   TSH   Ambulatory referral to Orthopedic Surgery    Referral Priority:  Routine    Referral Type:  Surgical    Referral Reason:  Specialty Services Required    Requested Specialty:  Orthopedic Surgery    Number of Visits Requested:  1   POCT urinalysis dipstick    By signing my name below, I, Raven Small, attest that this documentation has been prepared under the direction and in the presence of Ryan Simmer MD.  Electronically Signed: Andrew Au, ED Scribe. 01/12/2015. 3:38 PM.  I personally performed the services described in this documentation, which was scribed in my presence. The recorded information has been reviewed and considered.  Marland Kitchenkmsig

## 2015-01-13 LAB — HIV ANTIBODY (ROUTINE TESTING W REFLEX): HIV 1&2 Ab, 4th Generation: NONREACTIVE

## 2015-01-13 LAB — CBC WITH DIFFERENTIAL/PLATELET
BASOS PCT: 0 % (ref 0–1)
Basophils Absolute: 0 10*3/uL (ref 0.0–0.1)
EOS ABS: 0.1 10*3/uL (ref 0.0–0.7)
Eosinophils Relative: 1 % (ref 0–5)
HCT: 44.6 % (ref 39.0–52.0)
HEMOGLOBIN: 15.7 g/dL (ref 13.0–17.0)
Lymphocytes Relative: 27 % (ref 12–46)
Lymphs Abs: 2.1 10*3/uL (ref 0.7–4.0)
MCH: 31.3 pg (ref 26.0–34.0)
MCHC: 35.2 g/dL (ref 30.0–36.0)
MCV: 89 fL (ref 78.0–100.0)
MONOS PCT: 7 % (ref 3–12)
MPV: 8.7 fL (ref 8.6–12.4)
Monocytes Absolute: 0.5 10*3/uL (ref 0.1–1.0)
Neutro Abs: 5 10*3/uL (ref 1.7–7.7)
Neutrophils Relative %: 65 % (ref 43–77)
PLATELETS: 216 10*3/uL (ref 150–400)
RBC: 5.01 MIL/uL (ref 4.22–5.81)
RDW: 12.9 % (ref 11.5–15.5)
WBC: 7.7 10*3/uL (ref 4.0–10.5)

## 2015-01-13 LAB — COMPREHENSIVE METABOLIC PANEL
ALBUMIN: 4.5 g/dL (ref 3.6–5.1)
ALT: 50 U/L — ABNORMAL HIGH (ref 9–46)
AST: 35 U/L (ref 10–40)
Alkaline Phosphatase: 59 U/L (ref 40–115)
BUN: 13 mg/dL (ref 7–25)
CHLORIDE: 102 mmol/L (ref 98–110)
CO2: 27 mmol/L (ref 20–31)
Calcium: 9.6 mg/dL (ref 8.6–10.3)
Creat: 0.79 mg/dL (ref 0.60–1.35)
Glucose, Bld: 82 mg/dL (ref 65–99)
POTASSIUM: 4.2 mmol/L (ref 3.5–5.3)
Sodium: 136 mmol/L (ref 135–146)
TOTAL PROTEIN: 7.3 g/dL (ref 6.1–8.1)
Total Bilirubin: 0.9 mg/dL (ref 0.2–1.2)

## 2015-01-13 LAB — LIPID PANEL
CHOL/HDL RATIO: 6.4 ratio — AB (ref <=5.0)
CHOLESTEROL: 204 mg/dL — AB (ref 125–200)
HDL: 32 mg/dL — ABNORMAL LOW (ref >=40)
LDL Cholesterol: 142 mg/dL — ABNORMAL HIGH (ref <130)
TRIGLYCERIDES: 151 mg/dL — AB (ref <150)
VLDL: 30 mg/dL (ref <30)

## 2015-01-13 LAB — TSH: TSH: 1.539 u[IU]/mL (ref 0.350–4.500)

## 2015-01-14 LAB — HEMOGLOBIN A1C
Hgb A1c MFr Bld: 5.7 % — ABNORMAL HIGH (ref <5.7)
Mean Plasma Glucose: 117 mg/dL — ABNORMAL HIGH (ref <117)

## 2015-01-17 LAB — HEPATITIS PANEL, ACUTE
HCV Ab: NEGATIVE
HEP B C IGM: NONREACTIVE
Hep A IgM: NONREACTIVE
Hepatitis B Surface Ag: NEGATIVE

## 2015-02-14 ENCOUNTER — Ambulatory Visit (INDEPENDENT_AMBULATORY_CARE_PROVIDER_SITE_OTHER): Payer: BLUE CROSS/BLUE SHIELD

## 2015-02-14 ENCOUNTER — Encounter: Payer: Self-pay | Admitting: Urgent Care

## 2015-02-14 ENCOUNTER — Ambulatory Visit (INDEPENDENT_AMBULATORY_CARE_PROVIDER_SITE_OTHER): Payer: BLUE CROSS/BLUE SHIELD | Admitting: Urgent Care

## 2015-02-14 VITALS — BP 108/69 | HR 60 | Temp 98.5°F | Resp 16 | Ht 66.5 in | Wt 186.0 lb

## 2015-02-14 DIAGNOSIS — Z8739 Personal history of other diseases of the musculoskeletal system and connective tissue: Secondary | ICD-10-CM

## 2015-02-14 DIAGNOSIS — M25572 Pain in left ankle and joints of left foot: Secondary | ICD-10-CM

## 2015-02-14 DIAGNOSIS — R7303 Prediabetes: Secondary | ICD-10-CM

## 2015-02-14 DIAGNOSIS — M19071 Primary osteoarthritis, right ankle and foot: Secondary | ICD-10-CM

## 2015-02-14 DIAGNOSIS — M1712 Unilateral primary osteoarthritis, left knee: Secondary | ICD-10-CM

## 2015-02-14 DIAGNOSIS — M179 Osteoarthritis of knee, unspecified: Secondary | ICD-10-CM

## 2015-02-14 DIAGNOSIS — M25561 Pain in right knee: Secondary | ICD-10-CM

## 2015-02-14 DIAGNOSIS — R945 Abnormal results of liver function studies: Secondary | ICD-10-CM

## 2015-02-14 DIAGNOSIS — R7989 Other specified abnormal findings of blood chemistry: Secondary | ICD-10-CM | POA: Diagnosis not present

## 2015-02-14 DIAGNOSIS — Z87828 Personal history of other (healed) physical injury and trauma: Secondary | ICD-10-CM

## 2015-02-14 LAB — COMPREHENSIVE METABOLIC PANEL
ALT: 19 U/L (ref 9–46)
AST: 21 U/L (ref 10–40)
Albumin: 4.6 g/dL (ref 3.6–5.1)
Alkaline Phosphatase: 62 U/L (ref 40–115)
BILIRUBIN TOTAL: 0.7 mg/dL (ref 0.2–1.2)
BUN: 17 mg/dL (ref 7–25)
CHLORIDE: 103 mmol/L (ref 98–110)
CO2: 27 mmol/L (ref 20–31)
CREATININE: 0.74 mg/dL (ref 0.60–1.35)
Calcium: 9.7 mg/dL (ref 8.6–10.3)
Glucose, Bld: 83 mg/dL (ref 65–99)
Potassium: 4.2 mmol/L (ref 3.5–5.3)
SODIUM: 138 mmol/L (ref 135–146)
TOTAL PROTEIN: 7.3 g/dL (ref 6.1–8.1)

## 2015-02-14 MED ORDER — DICLOFENAC SODIUM 1 % TD GEL: TRANSDERMAL | Status: DC

## 2015-02-14 NOTE — Progress Notes (Signed)
MRN: [id number] DOB: 1975-11-14  Subjective:   Ryan Robles is a 39 y.o. male presenting for chief complaint of Follow-up  Elevated LFTs - patient had slightly elevated LFTs at his annual exam on 01/12/2015. He presents today for recheck. Denies alcohol use. Tested negative for hepatitis. Denies n/v, abdominal pain, jaundice.  Right ankle pain - was recently seen on 01/12/2015 for this, found to have osteoarthritis of his right ankle. Also has a history of significant left knee OA, meniscus tear. Today, he reports that his right knee and left ankle are now hurting in the same way that his right ankle has been hurting. 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 Has not been taking any NSAIDs for this because they have not helped, he also did not have his diclofenac gel filled for an unknown reason. Denies fever, trauma, redness, swelling, bony deformity, popping or tearing noises, numbness or tingling.  Denies any other aggravating or relieving factors, no other questions or concerns.  Ryan Robles  Also has no allergies on file.  Ryan Robles  has a past medical history of GERD (gastroesophageal reflux disease). Also  has past surgical history that includes Knee arthroscopy (2012).  Objective:   Vitals: BP 108/69 mmHg  Pulse 60  Temp(Src) 98.5 F (36.9 C) (Oral)  Resp 16  Ht 5' 6.5" (1.689 m)  Wt 186 lb (84.369 kg)  BMI 29.57 kg/m2  Physical Exam  Constitutional: He is oriented to person, place, and time. He appears well-developed and well-nourished.  HENT:  Mouth/Throat: Oropharynx is clear and moist.  Eyes: No scleral icterus.  Cardiovascular: Normal rate, regular rhythm and intact distal pulses.  Exam reveals no gallop and no friction  rub.   No murmur heard. Pulmonary/Chest: No respiratory distress. He has no wheezes. He has no rales.  Abdominal: Soft. Bowel sounds are normal. He exhibits no distension and no mass. There is no tenderness.  Musculoskeletal:       Right ankle: He exhibits normal range of motion, no swelling, no ecchymosis, no deformity and no laceration. Tenderness.  Neurological: He is alert and oriented to person, place, and time.  Skin: Skin is warm and dry. No rash noted. No erythema. No pallor.   [facility] reading PA-Ryan Robles. Right knee - degenerative changes more prominent on lateral side of knee, no acute process. Left ankle - mild joint space narrowing, please comment.  Dg Ankle Complete Left  02/14/2015  CLINICAL DATA:  Left ankle pain, no mention of trauma. EXAM: LEFT ANKLE COMPLETE - 3+ VIEW COMPARISON:  None in PACs FINDINGS: The ankle joint mortise is preserved. The talar dome is intact. There is subtle sub articular lucency in the medial aspect of the talar dome on the mortise oblique view which may reflect subarticular reactive change. There is no acute or old malleolar fracture. The talus and calcaneus exhibit no significant abnormalities. There is a tiny plantar calcaneal spur. IMPRESSION: There is no acute bony abnormality of the left ankle. Lucency in the medial aspect of the talar dome without overlying articular cortex disruption may reflect early degenerative change. Electronically Signed   By: Ryan  Robles M.D.   On: 02/14/2015 15:58   Dg Knee Complete 4 Views Right  02/14/2015  CLINICAL DATA:  Height knee pain EXAM: RIGHT KNEE - COMPLETE 4+ VIEW COMPARISON:  None. FINDINGS: The knee joint spaces are relatively well preserved. No significant degenerative change is seen. No effusion is noted. There is no evidence of fracture. IMPRESSION: Negative. Electronically Signed   By: Ryan DeePaul  Robles M.D.   On: 02/14/2015 15:57   Results for orders placed or performed in visit on 02/14/15 (from the past 24  hour(s))  Comprehensive metabolic panel     Status: None   Collection Time: 02/14/15  1:44 PM  Result Value Ref Range   Sodium 138 135 - 146 mmol/L   Potassium 4.2 3.5 - 5.3 mmol/L   Chloride 103 98 - 110 mmol/L   CO2 27 20 - 31 mmol/L   Glucose, Bld 83 65 - 99 mg/dL   BUN 17 7 - 25 mg/dL   Creat 1.610.74 0.960.60 - 0.451.35 mg/dL   Total Bilirubin 0.7 0.2 - 1.2 mg/dL   Alkaline Phosphatase 62 40 - 115 U/L   AST 21 10 - 40 U/L   ALT 19 9 - 46 U/L   Total Protein 7.3 6.1 - 8.1 g/dL   Albumin 4.6 3.6 - 5.1 g/dL   Calcium 9.7 8.6 - 40.910.3 mg/dL   Narrative   Performed at:  [facility]                [address], [address]                [place], [place] [address]   Assessment and Plan :   1. Osteoarthritis of ankle or foot, right 2. Left ankle pain 3. Right knee pain 4. Osteoarthritis of left knee, unspecified osteoarthritis type 5. 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 Right knee looks normal and may be due to compensating for his right ankle pain. Recommended modifications of his activity. Provided patient with script for diclofenac 1%, wear ankle brace for increased activity. If patient's symptoms persist, consider meloxicam depending on his LFTs or referral back to ortho for further management. Patient agreed.  6. Elevated LFTs - Labs pending  7. Pre-diabetes - Recommended dietary modifications, follow up in 6 months to 1 year.   Ryan BambergMario Auriella Wieand, PA-C [facility] [phone] 02/14/2015 1:08 PM

## 2015-02-14 NOTE — Patient Instructions (Signed)
Osteoartritis  (Osteoarthritis)  La osteoartritis es una enfermedad que provoca dolor e inflamación en las articulaciones. Ocurre cuando el cartílago de la articulación afectada se desgasta. El cartílago actúa como una almohadilla que cubre los extremos de los huesos que forman una articulación. La osteoartritis es la más frecuente de reumatismo articular. Afecta a menudo a los ancianos. Las articulaciones que se ven más afectadas por esta afección son las que se encuentran en las siguientes zonas:  · Los extremos de los dedos.  · Los pulgares.  · El cuello.  · La parte inferior de la espalda.  · Las rodillas.  · Las caderas  CAUSAS   Con el paso del tiempo, el cartílago que recubre los extremos de los huesos comienza a desgastarse. Esto provoca fricción entre los huesos, lo que causa dolor y entumecimiento en las articulaciones afectadas.   FACTORES DE RIESGO  Ciertos factores pueden aumentar las probabilidades de padecer osteoartritis, incluidos los siguientes:  · Edad avanzada.  · Exceso de peso corporal.  · Uso excesivo de la articulación.  · Lesión previa en la articulación.  SIGNOS Y SÍNTOMAS   · Dolor, hinchazón y entumecimiento en la articulación.  · Con el tiempo, la articulación pierde su forma normal.  · Pueden formarse pequeños depósitos de hueso (osteófitos) en los extremos de la articulación.  · Algunos trozos de hueso o cartílago pueden separarse y flotar dentro del espacio de la articulación. Esto puede causar más dolor y lesiones.  DIAGNÓSTICO   El médico le preguntará acerca de sus síntomas y le hará un examen físico. Le indicarán varios estudios, como:  · Radiografías de la articulación afectada.  · Análisis de sangre para descartar otros tipos de artritis.  Pueden usarse pruebas adicionales para diagnosticar la enfermedad.  TRATAMIENTO   Los objetivos del tratamiento son controlar el dolor y mejorar el funcionamiento de la articulación. Los planes de tratamiento pueden incluir lo siguiente:  · Un  programa de ejercicios recomendado que permita el descanso y el alivio de la articulación.  · Un plan de control del peso.  · Técnicas de alivio del dolor, como las siguientes:    Aplicación correcta de frío y calor.    Impulsos eléctricos enviados a las terminaciones nerviosas que se encuentran debajo de la piel (neuroestimulación eléctrica transcutánea [TENS]).    Masajes.    Ciertos suplementos nutricionales.  · Medicamentos para controlar el dolor como:    Paracetamol.    Antiinflamatorios no esteroides (AINE), como el naproxeno.    Narcóticos o agentes de acción central, como el tramadol.    Corticoides. Estos se pueden administrar por vía oral o mediante una inyección.  · Cirugía para reposicionar los huesos y aliviar el dolor (osteotomía) o para retirar las piezas sueltas de hueso y cartílago. Puede ser necesario el reemplazo de las articulaciones en estadios avanzados de la enfermedad.  INSTRUCCIONES PARA EL CUIDADO EN EL HOGAR   · Tome los medicamentos solamente como se lo haya indicado el médico.  · Mantenga un peso saludable. Siga las instrucciones del médico con respecto al control del peso. Esto puede incluir instrucciones sobre la dieta.  · Practique los ejercicios que le indiquen. Es posible que el médico le recomiende tipos específicos de ejercicios. Estos pueden incluir los siguientes:    Ejercicios de fortalecimiento Se realizan para fortalecer los músculos que sostienen las articulaciones afectadas por la artritis. Pueden realizarse con peso o con bandas para agregar resistencia.    Actividades aeróbicas. Son ejercicios como caminar   a paso ligero, gimnasia aeróbica de bajo impacto, que acelere el corazón.    Actividades de amplitud de movimientos. Dan agilidad a las articulaciones.    Ejercicios de equilibrio y agilidad. Ayudan a mantener las destrezas que se necesitan para la vida diaria.  · Haga descansar a las articulaciones según las indicaciones del médico.  · Concurra a todas las visitas de  control como se lo haya indicado el médico.  SOLICITE ATENCIÓN MÉDICA SI:   · La piel se pone roja.  · Aparece una erupción además del dolor en la articulación.  · El dolor en la articulación empeora.  · Tiene fiebre y siente dolor en la articulación o el músculo.  SOLICITE ATENCIÓN MÉDICA DE INMEDIATO SI:  · Nota una pérdida importante de peso o del apetito.  · Tiene transpiración nocturna.  PARA OBTENER MÁS INFORMACIÓN   · Instituto Nacional de Artritis y Enfermedades Musculoesqueléticas y Dermatológicas (National Institute of Arthritis and Musculoskeletal and Skin Diseases): www.niams.nih.gov.  · Instituto Nacional sobre el Envejecimiento (National Institute on Aging): www.nia.nih.gov.  · Instituto Norteamericano de Reumatología (American College of Rheumatology): www.rheumatology.org.     Esta información no tiene como fin reemplazar el consejo del médico. Asegúrese de hacerle al médico cualquier pregunta que tenga.     Document Released: 12/31/2004 Document Revised: 04/13/2014  Elsevier Interactive Patient Education ©2016 Elsevier Inc.

## 2015-02-15 DIAGNOSIS — M25572 Pain in left ankle and joints of left foot: Secondary | ICD-10-CM | POA: Insufficient documentation

## 2015-04-04 ENCOUNTER — Ambulatory Visit: Payer: Self-pay

## 2015-04-04 ENCOUNTER — Ambulatory Visit (INDEPENDENT_AMBULATORY_CARE_PROVIDER_SITE_OTHER): Payer: BLUE CROSS/BLUE SHIELD | Admitting: Physician Assistant

## 2015-04-04 VITALS — BP 112/80 | HR 106 | Temp 99.6°F | Resp 16 | Ht 66.5 in | Wt 189.0 lb

## 2015-04-04 DIAGNOSIS — M791 Myalgia, unspecified site: Secondary | ICD-10-CM

## 2015-04-04 DIAGNOSIS — R059 Cough, unspecified: Secondary | ICD-10-CM

## 2015-04-04 DIAGNOSIS — R509 Fever, unspecified: Secondary | ICD-10-CM

## 2015-04-04 DIAGNOSIS — J020 Streptococcal pharyngitis: Secondary | ICD-10-CM | POA: Diagnosis not present

## 2015-04-04 DIAGNOSIS — R05 Cough: Secondary | ICD-10-CM | POA: Diagnosis not present

## 2015-04-04 LAB — POCT CBC
GRANULOCYTE PERCENT: 86.7 % — AB (ref 37–80)
HEMATOCRIT: 46.7 % (ref 43.5–53.7)
Hemoglobin: 16.1 g/dL (ref 14.1–18.1)
Lymph, poc: 1.1 (ref 0.6–3.4)
MCH, POC: 31 pg (ref 27–31.2)
MCHC: 34.6 g/dL (ref 31.8–35.4)
MCV: 89.5 fL (ref 80–97)
MID (CBC): 0.6 (ref 0–0.9)
MPV: 6.5 fL (ref 0–99.8)
PLATELET COUNT, POC: 162 10*3/uL (ref 142–424)
POC GRANULOCYTE: 10.9 — AB (ref 2–6.9)
POC LYMPH %: 8.6 % — AB (ref 10–50)
POC MID %: 4.7 %M (ref 0–12)
RBC: 5.21 M/uL (ref 4.69–6.13)
RDW, POC: 13 %
WBC: 12.6 10*3/uL — AB (ref 4.6–10.2)

## 2015-04-04 LAB — POCT RAPID STREP A (OFFICE): Rapid Strep A Screen: POSITIVE — AB

## 2015-04-04 LAB — POCT INFLUENZA A/B
INFLUENZA A, POC: NEGATIVE
INFLUENZA B, POC: NEGATIVE

## 2015-04-04 LAB — POCT UA - MICROSCOPIC ONLY: Mucus, UA: ABSENT

## 2015-04-04 MED ORDER — PENICILLIN G BENZATHINE 1200000 UNIT/2ML IM SUSP
1.2000 10*6.[IU] | Freq: Once | INTRAMUSCULAR | Status: AC
  Administered 2015-04-04: 1.2 10*6.[IU] via INTRAMUSCULAR

## 2015-04-04 NOTE — Patient Instructions (Signed)
Faringitis estreptoccica (Strep Throat) La faringitis estreptoccica es una infeccin bacteriana que se produce en la garganta. El mdico puede llamarla amigdalitis o faringitis, en funcin de si hay inflamacin de las amgdalas o de la zona posterior de la garganta. La faringitis estreptoccica es ms frecuente durante los meses fros del ao en los nios de 5a 15aos, pero puede ocurrir durante cualquier estacin y en personas de todas las edades. La infeccin se transmite de una persona a otra (es contagiosa) a travs de la tos, el estornudo o el contacto directo. CAUSAS La faringitis estreptoccica es causada por la especie de bacterias Streptococcus pyogenes. FACTORES DE RIESGO Es ms probable que esta afeccin se manifieste en:  Las personas que pasan tiempo en lugares en los que hay mucha gente, donde la infeccin se puede diseminar fcilmente.  Las personas que tienen contacto cercano con alguien que padece faringitis estreptoccica. SNTOMAS Los sntomas de esta afeccin incluyen lo siguiente:  Fiebre o escalofros.   Enrojecimiento, inflamacin o dolor de las amgdalas o la garganta.  Dolor o dificultad para tragar.  Manchas blancas o amarillas en las amgdalas o la garganta.  Ganglios hinchados o dolorosos con la palpacin en el cuello o debajo de la mandbula.  Erupcin roja en todo el cuerpo (poco frecuente). DIAGNSTICO Para diagnosticar esta afeccin, se realiza una prueba rpida para estreptococos o un hisopado de la garganta (cultivo de las secreciones de la garganta). Los resultados de la prueba rpida para estreptococos suelen estar listos en pocos minutos, pero los del cultivo de las secreciones de la garganta tardan uno o dos das. TRATAMIENTO Esta enfermedad se trata con antibiticos. INSTRUCCIONES PARA EL CUIDADO EN EL HOGAR Medicamentos  Tome los medicamentos de venta libre y los recetados solamente como se lo haya indicado el mdico.  Tome los  antibiticos como se lo haya indicado el mdico. No deje de tomar los antibiticos aunque comience a sentirse mejor.  Haga que los miembros de la familia que tambin tienen dolor de garganta o fiebre se hagan pruebas de deteccin de la faringitis estreptoccica. Tal vez deban toma antibiticos si tienen la enfermedad. Comida y bebida  No comparta alimentos, tazas ni artculos personales que podran contagiar la infeccin a otras personas.  Si tiene dificultad para tragar, intente consumir alimentos blandos hasta que el dolor de garganta mejore.  Beba suficiente lquido para mantener la orina clara o de color amarillo plido. Instrucciones generales  Haga grgaras con una mezcla de agua y sal 3 o 4veces al da, o cuando sea necesario. Para preparar la mezcla de agua y sal, disuelva totalmente de media a 1cucharadita de sal en 1taza de agua tibia.  Asegrese de que todas las personas con las que convive se laven bien las manos.  Descanse lo suficiente.  No concurra a la escuela o al trabajo hasta que haya tomado los antibiticos durante 24horas.  Concurra a todas las visitas de control como se lo haya indicado el mdico. Esto es importante. SOLICITE ATENCIN MDICA SI:  Los ganglios del cuello siguen agrandndose.  Aparece una erupcin cutnea, tos o dolor de odos.  Tose y expectora un lquido espeso de color verde o amarillo amarronado, o con sangre.  Tiene dolor o molestias que no mejoran con medicamentos.  Los problemas parecen empeorar en lugar de mejorar.  Tiene fiebre. SOLICITE ATENCIN MDICA DE INMEDIATO SI:  Tiene sntomas nuevos, como vmitos, dolor de cabeza intenso, rigidez o dolor en el cuello, dolor en el pecho o falta de aire.    Le duele mucho la garganta, babea o tiene cambios en la visin.  Siente que el cuello se le hincha o que la piel de esa zona se vuelve roja y sensible.  Tiene signos de deshidratacin, como fatiga, boca seca y disminucin de la  cantidad [place].  Comienza a sentir mucho sueo, o no logra despertarse por completo.  Las articulaciones estn enrojecidas o le duelen.   Esta informacin no tiene Theme park manager el consejo del mdico. Asegrese de hacerle al mdico cualquier pregunta que tenga.   Document Released: 12/31/2004 Document Revised: 12/12/2014 Elsevier Interactive Patient Education 2016 Elsevier Inc.   Strep Throat Strep throat is a bacterial infection of the throat. Your health care provider may call the infection tonsillitis or pharyngitis, depending on whether there is swelling in the tonsils or at the back of the throat. Strep throat is most common during the cold months of the year in children who are 21-9 years of age, but it can happen during any season in people of any age. This infection is spread from person to person (contagious) through coughing, sneezing, or close contact. CAUSES Strep throat is caused by the bacteria called Streptococcus pyogenes. RISK FACTORS This condition is more likely to develop in:  People who spend time in crowded places where the infection can spread easily.  People who have close contact with someone who has strep throat. SYMPTOMS Symptoms of this condition include:  Fever or chills.   Redness, swelling, or pain in the tonsils or throat.  Pain or difficulty when swallowing.  White or yellow spots on the tonsils or throat.  Swollen, tender glands in the neck or under the jaw.  Red rash all over the body (rare). DIAGNOSIS This condition is diagnosed by performing a rapid strep test or by taking a swab of your throat (throat culture test). Results from a rapid strep test are usually ready in a few minutes, but throat culture test results are available after one or two days. TREATMENT This condition is treated with antibiotic medicine. HOME CARE INSTRUCTIONS Medicines  Take over-the-counter and prescription medicines only as told by your health care  provider.  Take your antibiotic as told by your health care provider. Do not stop taking the antibiotic even if you start to feel better.  Have family members who also have a sore throat or fever tested for strep throat. They may need antibiotics if they have the strep infection. Eating and Drinking  Do not share food, drinking cups, or personal items that could cause the infection to spread to other people.  If swallowing is difficult, try eating soft foods until your sore throat feels better.  Drink enough fluid to keep your urine clear or pale yellow. General Instructions  Gargle with a salt-water mixture 3-4 times per day or as needed. To make a salt-water mixture, completely dissolve -1 tsp of salt in 1 cup of warm water.  Make sure that all household members wash their hands well.  Get plenty of rest.  Stay home from school or work until you have been taking antibiotics for 24 hours.  Keep all follow-up visits as told by your health care provider. This is important. SEEK MEDICAL CARE IF:  The glands in your neck continue to get bigger.  You develop a rash, cough, or earache.  You cough up a thick liquid that is green, yellow-brown, or bloody.  You have pain or discomfort that does not get better with medicine.  Your problems  seem to be getting worse rather than better.  You have a fever. SEEK IMMEDIATE MEDICAL CARE IF:  You have new symptoms, such as vomiting, severe headache, stiff or painful neck, chest pain, or shortness of breath.  You have severe throat pain, drooling, or changes in your voice.  You have swelling of the neck, or the skin on the neck becomes red and tender.  You have signs of dehydration, such as fatigue, dry mouth, and decreased urination.  You become increasingly sleepy, or you cannot wake up completely.  Your joints become red or painful.   This information is not intended to replace advice given to you by your health care provider.  Make sure you discuss any questions you have with your health care provider.   Document Released: 03/20/2000 Document Revised: 12/12/2014 Document Reviewed: 07/16/2014 Elsevier Interactive Patient Education [employer]2016 Elsevier Inc.

## 2015-04-04 NOTE — Progress Notes (Signed)
Patient ID: Ryan Robles, male    DOB: [birth year], 39 y.o.   MRN: [id number]  PCP: No PCP Per Patient  Subjective:   Chief Complaint  Patient presents with  . Headache    Onset yesterday  . Generalized Body Aches  . Fever    HPI Presents for evaluation of sudden onset of illness yesterday 10 am.  He reports having over eaten the previous night, but got up at went to work as usual. He was working hard and fast, per usual, when he stopped at 10 am for a snack of a banana and a piece of chocolate. He immediately felt ill. Headache, nausea, vomiting, achy all over, subjective fever and chills.  Nausea and vomiting have stopped. Headache, fever/chills, and aces persist. Sore throat. Ear pain. Mild cough. No diarrhea. No rash.    Review of Systems As above.    Patient Active Problem List   Diagnosis Date Noted  . Left ankle pain 02/15/2015  . NSAID induced gastritis 11/09/2014  . Osteoarthritis of ankle or foot 11/09/2014  . Right ankle pain 11/09/2014  . Generalized abdominal pain 11/09/2014     Prior to Admission medications   Medication Sig Start Date End Date Taking? Authorizing Provider  diclofenac sodium (VOLTAREN) 1 % GEL 1 application daily for joint pain. 02/14/15  Yes Wallis BambergMario Mani, PA-C     No Known Allergies     Objective:  Physical Exam  Constitutional: He is oriented to person, place, and time. He appears well-developed and well-nourished. He is active and cooperative.  Non-toxic appearance. He appears ill. No distress.  BP 112/80 mmHg  Pulse 106  Temp(Src) 99.6 F (37.6 C) (Oral)  Resp 16  Ht 5' 6.5" (1.689 m)  Wt 189 lb (85.73 kg)  BMI 30.05 kg/m2  SpO2 98%  HENT:  Head: Normocephalic and atraumatic.  Right Ear: Hearing normal.  Left Ear: Hearing normal.  Eyes: Conjunctivae are normal. No scleral icterus.  Neck: Normal range of motion. Neck supple. No thyromegaly present.  Cardiovascular: Normal rate, regular rhythm and normal heart  sounds.   Pulses:      Radial pulses are 2+ on the right side, and 2+ on the left side.  Pulmonary/Chest: Effort normal and breath sounds normal.  Abdominal: Soft. Normal appearance and bowel sounds are normal. There is no hepatosplenomegaly. There is no tenderness.  Lymphadenopathy:       Head (right side): No tonsillar, no preauricular, no posterior auricular and no occipital adenopathy present.       Head (left side): No tonsillar, no preauricular, no posterior auricular and no occipital adenopathy present.    He has no cervical adenopathy.       Right: No supraclavicular adenopathy present.       Left: No supraclavicular adenopathy present.  Neurological: He is alert and oriented to person, place, and time. No sensory deficit.  Skin: Skin is warm, dry and intact. No rash noted. No cyanosis or erythema. Nails show no clubbing.  Psychiatric: He has a normal mood and affect. His speech is normal and behavior is normal.   Results for orders placed or performed in visit on 04/04/15  POCT Influenza A/B  Result Value Ref Range   Influenza A, POC Negative Negative   Influenza B, POC Negative Negative  POCT CBC  Result Value Ref Range   WBC 12.6 (A) 4.6 - 10.2 K/uL   Lymph, poc 1.1 0.6 - 3.4   POC LYMPH PERCENT 8.6 (A) 10 -  50 %L   MID (cbc) 0.6 0 - 0.9   POC MID % 4.7 0 - 12 %M   POC Granulocyte 10.9 (A) 2 - 6.9   Granulocyte percent 86.7 (A) 37 - 80 %G   RBC 5.21 4.69 - 6.13 M/uL   Hemoglobin 16.1 14.1 - 18.1 g/dL   HCT, POC 40.9 81.1 - 53.7 %   MCV 89.5 80 - 97 fL   MCH, POC 31.0 27 - 31.2 pg   MCHC 34.6 31.8 - 35.4 g/dL   RDW, POC 91.4 %   Platelet Count, POC 162 142 - 424 K/uL   MPV 6.5 0 - 99.8 fL  POCT rapid strep A  Result Value Ref Range   Rapid Strep A Screen Positive (A) Negative            Assessment & Plan:   1. Fever, unspecified - POCT Influenza A/B - POCT CBC - POCT rapid strep A  2. Muscle ache 3. Cough  4. Strep pharyngitis Supportive care.   Anticipatory guidance.  RTC if symptoms worsen/persist. - penicillin g benzathine (BICILLIN LA) 1200000 UNIT/2ML injection 1.2 Million Units; Inject 2 mLs (1.2 Million Units total) into the muscle once.   Fernande Bras, PA-C Physician Assistant-Certified [facility]

## 2015-04-25 ENCOUNTER — Telehealth: Payer: Self-pay | Admitting: Physician Assistant

## 2015-04-25 ENCOUNTER — Other Ambulatory Visit: Payer: Self-pay

## 2015-04-25 ENCOUNTER — Ambulatory Visit (INDEPENDENT_AMBULATORY_CARE_PROVIDER_SITE_OTHER): Payer: BLUE CROSS/BLUE SHIELD | Admitting: Physician Assistant

## 2015-04-25 VITALS — BP 118/80 | HR 66 | Temp 98.3°F | Ht 67.0 in | Wt 191.0 lb

## 2015-04-25 DIAGNOSIS — R109 Unspecified abdominal pain: Secondary | ICD-10-CM | POA: Diagnosis not present

## 2015-04-25 DIAGNOSIS — R319 Hematuria, unspecified: Secondary | ICD-10-CM | POA: Diagnosis not present

## 2015-04-25 DIAGNOSIS — N200 Calculus of kidney: Secondary | ICD-10-CM

## 2015-04-25 DIAGNOSIS — R309 Painful micturition, unspecified: Secondary | ICD-10-CM | POA: Diagnosis not present

## 2015-04-25 LAB — POCT URINALYSIS DIPSTICK
BILIRUBIN UA: NEGATIVE
GLUCOSE UA: NEGATIVE
KETONES UA: NEGATIVE
LEUKOCYTES UA: NEGATIVE
Nitrite, UA: NEGATIVE
PROTEIN UA: NEGATIVE
Spec Grav, UA: <=1.005
Urobilinogen, UA: 0.2
pH, UA: 5

## 2015-04-25 MED ORDER — HYDROCODONE-ACETAMINOPHEN 5-325 MG PO TABS: 1.0000 | ORAL_TABLET | Freq: Four times a day (QID) | ORAL | Status: DC | PRN

## 2015-04-25 NOTE — Telephone Encounter (Signed)
Please call this patient. We sent him for a CT scan this morning, but I don't see that the scan has been started.

## 2015-04-25 NOTE — Progress Notes (Signed)
Patient ID: Ryan Robles, male    DOB: 09-03-1975, 40 y.o.   MRN: [id number]  PCP: No PCP Per Patient  Subjective:   Chief Complaint  Patient presents with  . Flank Pain    right sided x 1 day  . Dysuria    painful x 1 day  . Hematuria    pt reports blood appearing in urine x 1 day  . Emesis    x 1 episode due to pain    HPI Presents for evaluation of a one day history of right sided flank pain, dysuria (burning), hematuria, and one episode of emesis.   Past medical history includes GERD and past episode of NSAID induced gastritis. The pain woke him up around 3am last night. The pain is located on his right flank and wraps around to his RLQ. It is a constant pain that he is unable to describes with word and rates as a 10/10. It is not aggravated by movement. He tried Catering manager with no relief. He tried  ibuprofen in which the pain was reduced to a 5/10. He presents today because the pain is still presents. He notes that he has a history of kidney stones about 3-5 years ago.   Associated symptoms include anorexia, fatigue, increased frequency and difficulty with urination. Denies fever, chills, SOB, cough, chest pain, N/D, blood in stool, or GU symptoms. The patient last sexual encounter was Friday in which he used condoms.    Review of Systems Constitutional: Positive for appetite change and fatigue. Negative for fever and chills.  Respiratory: Negative for cough and shortness of breath.  Cardiovascular: Positive for palpitations. Negative for chest pain.  Gastrointestinal: Positive for nausea, vomiting and abdominal pain (RLQ). Negative for diarrhea, constipation and blood in stool.  Genitourinary: Positive for dysuria (burning), frequency (due to drinking more), hematuria, flank pain and difficulty urinating. Negative for discharge, penile swelling, scrotal swelling, enuresis, penile pain and testicular pain.     Patient Active Problem List   Diagnosis Date Noted  .  Nephrolithiasis 04/25/2015  . Osteoarthritis of ankle or foot 11/09/2014     Prior to Admission medications   Medication Sig Start Date End Date Taking? Authorizing Provider  diclofenac sodium (VOLTAREN) 1 % GEL 1 application daily for joint pain. 02/14/15  Yes Wallis Bamberg, PA-C     No Known Allergies     Objective:  Physical Exam  Constitutional: He is oriented to person, place, and time. Vital signs are normal. He appears well-developed and well-nourished. He is active and cooperative. No distress.  BP 118/80 mmHg  Pulse 66  Temp(Src) 98.3 F (36.8 C) (Oral)  Ht  (1.702 m)  Wt 191 lb (86.637 kg)  BMI 29.91 kg/m2  SpO2 98%  HENT:  Head: Normocephalic and atraumatic.  Right Ear: Hearing normal.  Left Ear: Hearing normal.  Eyes: Conjunctivae are normal. No scleral icterus.  Neck: Normal range of motion. Neck supple. No thyromegaly present.  Cardiovascular: Normal rate, regular rhythm and normal heart sounds.   Pulses:      Radial pulses are 2+ on the right side, and 2+ on the left side.  Pulmonary/Chest: Effort normal and breath sounds normal.  Abdominal: Normal appearance and bowel sounds are normal. There is no hepatosplenomegaly. There is tenderness in the suprapubic area. There is CVA tenderness (RIGHT). No hernia.  Lymphadenopathy:       Head (right side): No tonsillar, no preauricular, no posterior auricular and no occipital adenopathy present.  Head (left side): No tonsillar, no preauricular, no posterior auricular and no occipital adenopathy present.    He has no cervical adenopathy.       Right: No supraclavicular adenopathy present.       Left: No supraclavicular adenopathy present.  Neurological: He is alert and oriented to person, place, and time. No sensory deficit.  Skin: Skin is warm, dry and intact. No rash noted. No cyanosis or erythema. Nails show no clubbing.  Psychiatric: He has a normal mood and affect.       Results for orders placed or  performed in visit on 04/25/15  POCT UA - Microscopic Only  Result Value Ref Range   WBC, Ur, HPF, POC none    RBC, urine, microscopic moderate    Bacteria, U Microscopic few    Mucus, UA absent    Epithelial cells, urine per micros few    Crystals, Ur, HPF, POC none    Casts, Ur, LPF, POC none    Yeast, UA none   POCT urinalysis dipstick  Result Value Ref Range   Color, UA yellow    Clarity, UA clear    Glucose, UA negative    Bilirubin, UA negative    Ketones, UA negative    Spec Grav, UA <=1.005    Blood, UA large    pH, UA 5.0    Protein, UA negative    Urobilinogen, UA 0.2    Nitrite, UA negative    Leukocytes, UA Negative Negative       Assessment & Plan:   1. Right flank pain 2. Pain with urination 3. Hematuria Suspect nephrolithiasis. No evidence of infectious process. CT today. Supportive care. Consider Flomax. - POCT UA - Microscopic Only - POCT urinalysis dipstick - CT Abdomen Pelvis Wo Contrast; Future - HYDROcodone-acetaminophen (NORCO/VICODIN) 5-325 MG tablet; Take 1 tablet by mouth every 6 (six) hours as needed for moderate pain or severe pain.  Dispense: 30 tablet; Refill: 0    Fernande Bras, PA-C Physician Assistant-Certified [facility]

## 2015-04-25 NOTE — Progress Notes (Signed)
  Medical screening examination/treatment/procedure(s) were performed by non-physician practitioner and as supervising physician I was immediately available for consultation/collaboration.     

## 2015-04-25 NOTE — Progress Notes (Signed)
Subjective:    Patient ID: Ryan Robles, male    DOB: 01-11-1976, 40 y.o.   MRN: [id number]  Chief Complaint  Patient presents with  . Flank Pain    right sided x 1 day  . Dysuria    painful x 1 day  . Hematuria    pt reports blood appearing in urine x 1 day  . Emesis    x 1 episode due to pain    HPI Patient presents today with a one day history of right sided flank pain, dysuria (burning), hematuria, and one episode of emesis. Past medical history includes GERD and past episode of NSAID induced gastritis.  The pain woke him up around 3am last night. The pain is located on his right flank and wraps around to his RLQ. It is a constant pain that he is unable to describes with word and rates as a 10/10. It is not aggravated by movement. He tried Catering manager with no relief. He tried  ibuprofen in which the pain was reduced to a 5/10. He presents today because the pain is still presents. He notes that he has a history of kidney stones about 3-5 years ago. Associated symptoms include anorexia, fatigue, increased frequency and difficulty with urination. Denies fever, chills, SOB, cough, chest pain, N/D, blood in stool, or GU symptoms. The patient last sexual encounter was Friday in which he used condoms.   Review of Systems  Constitutional: Positive for appetite change and fatigue. Negative for fever and chills.  Respiratory: Negative for cough and shortness of breath.   Cardiovascular: Positive for palpitations. Negative for chest pain.  Gastrointestinal: Positive for nausea, vomiting and abdominal pain (RLQ). Negative for diarrhea, constipation and blood in stool.  Genitourinary: Positive for dysuria (burning), frequency (due to drinking more), hematuria, flank pain and difficulty urinating. Negative for discharge, penile swelling, scrotal swelling, enuresis, penile pain and testicular pain.   No Known Allergies  Prior to Admission medications   Medication Sig Start Date End Date  Taking? Authorizing Provider  diclofenac sodium (VOLTAREN) 1 % GEL 1 application daily for joint pain. 02/14/15  Yes Wallis Bamberg, PA-C   Patient Active Problem List   Diagnosis Date Noted  . Nephrolithiasis 04/25/2015  . Osteoarthritis of ankle or foot 11/09/2014      Objective:   Physical Exam  Constitutional: He appears well-developed and well-nourished. No distress.  BP 118/80 mmHg  Pulse 66  Temp(Src) 98.3 F (36.8 C) (Oral)  Ht  (1.702 m)  Wt 191 lb (86.637 kg)  BMI 29.91 kg/m2  SpO2 98%   HENT:  Head: Normocephalic and atraumatic.  Eyes: Conjunctivae are normal. Pupils are equal, round, and reactive to light. No scleral icterus.  Neck: Neck supple.  Cardiovascular: Normal rate, regular rhythm, S1 normal, S2 normal, normal heart sounds and normal pulses.   Pulmonary/Chest: Effort normal and breath sounds normal.  Abdominal: Soft. Normal appearance and bowel sounds are normal. He exhibits no shifting dullness, no pulsatile liver, no abdominal bruit and no mass. There is no hepatosplenomegaly. There is tenderness in the suprapubic area. There is CVA tenderness (right sided). There is no rigidity, no rebound, no guarding, no tenderness at McBurney's point and negative Murphy's sign.  Lymphadenopathy:       Head (right side): No submental, no submandibular, no tonsillar, no preauricular, no posterior auricular and no occipital adenopathy present.       Head (left side): No submental, no submandibular, no tonsillar, no preauricular, no  posterior auricular and no occipital adenopathy present.    He has no cervical adenopathy.  Vitals reviewed.  Results for orders placed or performed in visit on 04/25/15  POCT UA - Microscopic Only  Result Value Ref Range   WBC, Ur, HPF, POC none    RBC, urine, microscopic moderate    Bacteria, U Microscopic few    Mucus, UA absent    Epithelial cells, urine per micros few    Crystals, Ur, HPF, POC none    Casts, Ur, LPF, POC none     Yeast, UA none   POCT urinalysis dipstick  Result Value Ref Range   Color, UA yellow    Clarity, UA clear    Glucose, UA negative    Bilirubin, UA negative    Ketones, UA negative    Spec Grav, UA <=1.005    Blood, UA large    pH, UA 5.0    Protein, UA negative    Urobilinogen, UA 0.2    Nitrite, UA negative    Leukocytes, UA Negative Negative      Assessment & Plan:  1. Right flank pain - POCT UA - Microscopic Only - POCT urinalysis dipstick - CT Abdomen Pelvis Wo Contrast; Future - HYDROcodone-acetaminophen (NORCO/VICODIN) 5-325 MG tablet; Take 1 tablet by mouth every 6 (six) hours as needed for moderate pain or severe pain.  Dispense: 30 tablet; Refill: 0  2. Pain with urination - POCT UA - Microscopic Only - POCT urinalysis dipstick  3. Hematuria - POCT UA - Microscopic Only - POCT urinalysis dipstick - CT Abdomen Pelvis Wo Contrast; Future  4. Nephrolithiasis -Referal to [facility] imaging for a non-contrast CT  No Follow-up on file.

## 2015-04-25 NOTE — Patient Instructions (Addendum)
Your CT scan will be at [facility] located at [address] at 10:40

## 2015-04-25 NOTE — Addendum Note (Signed)
Addended by: Carmelina Dane on: 04/25/2015 03:51 PM   Modules accepted: Kipp Brood

## 2015-04-26 ENCOUNTER — Ambulatory Visit
Admission: RE | Admit: 2015-04-26 | Discharge: 2015-04-26 | Disposition: A | Discharge status: Home / Self Care | Payer: BLUE CROSS/BLUE SHIELD | Source: Ambulatory Visit | Attending: Physician Assistant | Admitting: Physician Assistant

## 2015-04-26 DIAGNOSIS — R319 Hematuria, unspecified: Secondary | ICD-10-CM

## 2015-04-26 DIAGNOSIS — R109 Unspecified abdominal pain: Secondary | ICD-10-CM

## 2015-04-26 MED ORDER — TAMSULOSIN HCL 0.4 MG PO CAPS
0.4000 mg | ORAL_CAPSULE | Freq: Every day | ORAL | Status: DC
Start: 2015-04-26 — End: 2015-09-13

## 2015-04-26 NOTE — Telephone Encounter (Signed)
SPoke with pt, he is going today.

## 2015-04-26 NOTE — Telephone Encounter (Signed)
Reviewed his CT results.  We will start him on Flomax and give him 24 h for symptoms to improve with urethral dilation- if he develops fever or chills or worsening pain he should go to the ED because of obstruction seen on CT.

## 2015-04-26 NOTE — Telephone Encounter (Signed)
Pt's interpreter notified and states understanding.  [name] FYI

## 2015-04-30 ENCOUNTER — Other Ambulatory Visit: Payer: Self-pay | Admitting: Physician Assistant

## 2015-04-30 DIAGNOSIS — Q5310 Unspecified undescended testicle, unilateral: Secondary | ICD-10-CM

## 2015-04-30 DIAGNOSIS — R748 Abnormal levels of other serum enzymes: Secondary | ICD-10-CM

## 2015-05-03 ENCOUNTER — Ambulatory Visit
Admission: RE | Admit: 2015-05-03 | Discharge: 2015-05-03 | Disposition: A | Discharge status: Home / Self Care | Payer: BLUE CROSS/BLUE SHIELD | Source: Ambulatory Visit | Attending: Physician Assistant | Admitting: Physician Assistant

## 2015-05-03 DIAGNOSIS — R748 Abnormal levels of other serum enzymes: Secondary | ICD-10-CM

## 2015-05-07 ENCOUNTER — Telehealth: Payer: Self-pay

## 2015-05-07 NOTE — Telephone Encounter (Signed)
We've called him several times, and it's documented, that we spoke to him. He has a kidney stone, and his RIGHT testicle is high-inside the inguinal canal.  The [place] was normal. For that, he's been referred to urology. They can manage the kidney stone if it's still a problem then, though he may pass it (or already have) before then.

## 2015-05-07 NOTE — Telephone Encounter (Signed)
Spanish speaking patient would like to know his results for kidney stone.    Please call [phone]

## 2015-05-07 NOTE — Telephone Encounter (Signed)
[name],  Please see previous messgae

## 2015-05-09 NOTE — Telephone Encounter (Signed)
Pt notified of [place] results.  He went to urology and they told him that he had a little kidney stone and put him on Flomax.  Advised pt that he should continue what they put him and and to follow up with urologist in 2 weeks.

## 2015-07-28 ENCOUNTER — Encounter: Payer: Self-pay | Admitting: Physician Assistant

## 2015-07-28 DIAGNOSIS — N200 Calculus of kidney: Secondary | ICD-10-CM

## 2015-07-28 DIAGNOSIS — Q5522 Retractile testis: Secondary | ICD-10-CM

## 2015-09-13 ENCOUNTER — Ambulatory Visit (INDEPENDENT_AMBULATORY_CARE_PROVIDER_SITE_OTHER): Payer: BLUE CROSS/BLUE SHIELD

## 2015-09-13 ENCOUNTER — Ambulatory Visit (INDEPENDENT_AMBULATORY_CARE_PROVIDER_SITE_OTHER): Payer: BLUE CROSS/BLUE SHIELD | Admitting: Urgent Care

## 2015-09-13 VITALS — BP 134/82 | HR 60 | Temp 98.0°F | Resp 16 | Wt 185.0 lb

## 2015-09-13 DIAGNOSIS — M19071 Primary osteoarthritis, right ankle and foot: Secondary | ICD-10-CM

## 2015-09-13 DIAGNOSIS — M129 Arthropathy, unspecified: Secondary | ICD-10-CM

## 2015-09-13 DIAGNOSIS — M545 Low back pain: Secondary | ICD-10-CM

## 2015-09-13 DIAGNOSIS — M25571 Pain in right ankle and joints of right foot: Secondary | ICD-10-CM | POA: Diagnosis not present

## 2015-09-13 MED ORDER — MELOXICAM 7.5 MG PO TABS: ORAL_TABLET | ORAL | Status: DC

## 2015-09-13 MED ORDER — TRAMADOL HCL 50 MG PO TABS: 50.0000 mg | ORAL_TABLET | Freq: Three times a day (TID) | ORAL | Status: DC | PRN

## 2015-09-13 MED ORDER — PREDNISONE 20 MG PO TABS: ORAL_TABLET | ORAL | Status: DC

## 2015-09-13 NOTE — Patient Instructions (Addendum)
Artritis (Arthritis) El trmino artritis se usa comnmente para hacer referencia al dolor de las articulaciones o a la enfermedad articular. Hay ms de 100tipos de artritis. CAUSAS La causa ms frecuente de esta afeccin es el desgaste de una articulacin. Algunas otras causas son las siguientes:  Gota.  Inflamacin de una articulacin.  Una infeccin de una articulacin.  Esguinces y otras lesiones cerca de la articulacin.  Una reaccin farmacolgica o alrgica. En algunos casos, es posible que la causa no se conozca. SNTOMAS El sntoma principal de esta afeccin es el dolor de la articulacin con el movimiento. Otros sntomas pueden ser los siguientes:  Enrojecimiento, hinchazn o rigidez de una articulacin.  Calor que emana de la articulacin.  Fiebre.  Sensacin generalizada de estar enfermo. DIAGNSTICO Esta afeccin se puede diagnosticar mediante un examen fsico y estudios, entre ellos:  Anlisis de sangre.  Anlisis de orina.  Estudios de diagnstico por imgenes, como una resonancia magntica (RM), radiografas o una tomografa computarizada (TC). A veces, se extrae lquido de una articulacin para analizarlo. TRATAMIENTO El tratamiento de esta afeccin puede incluir lo siguiente:  El tratamiento de la causa, si se conoce.  Reposo.  Mantener elevada la articulacin.  Aplicar compresas fras o calientes en la articulacin.  Medicamentos para aliviar los sntomas y reducir la inflamacin.  Inyecciones de un corticoide, como cortisona, en la articulacin para ayudar a aliviar el dolor y reducir la inflamacin. Segn la causa de la artritis, tal vez haya que hacer cambios en el estilo de vida para reducir la carga sobre la articulacin. Algunos de los cambios incluyen realizar ms actividad fsica y bajar de peso, entre otros. INSTRUCCIONES PARA EL CUIDADO EN EL HOGAR Medicamentos  Tome los medicamentos de venta libre y los recetados solamente como se lo  haya indicado el mdico.  No tome aspirina para aliviar el dolor si se sospecha la presencia de gota. Actividades  Ponga en reposo la articulacin como se lo haya indicado el mdico. El reposo es importante cuando la enfermedad est activa y la articulacin le duele, est hinchada o rgida.  Evite las actividades que intensifiquen el dolor. Es importante encontrar el equilibrio entre la actividad y el reposo.  Con frecuencia, realice ejercicios de flexibilidad articular como se lo haya indicado el mdico. Intente realizar ejercicios de bajo impacto, por ejemplo:  Natacin.  Gimnasia acutica.  Andar en bicicleta.  Caminar. Cuidado de la articulacin  Si la articulacin se le hincha, mantngala elevada como se lo haya indicado el mdico.  Si al despertar por la maana, nota que la articulacin est rgida, intente tomar una ducha con agua tibia.  Si se lo indican, pngase calor en la articulacin. Si es diabtico, no se aplique calor sin la autorizacin del mdico.  Coloque una toalla entre la articulacin y la compresa caliente o la almohadilla trmica.  Coloque el calor en la zona durante 20 o 30minutos.  Si se lo indican, pngase hielo en la articulacin:  Ponga el hielo en una bolsa plstica.  Coloque una toalla entre la piel y la bolsa de hielo.  Coloque el hielo durante 20minutos, 2 a 3veces por da.  Concurra a todas las visitas de control como se lo haya indicado el mdico. Esto es importante. SOLICITE ATENCIN MDICA SI:  El dolor empeora.  Tiene fiebre. SOLICITE ATENCIN MDICA DE INMEDIATO SI:  Siente dolor, u observa hinchazn o enrojecimiento en la articulacin.  Siente dolor en muchas articulaciones y se le hinchan.  Siente un dolor   intenso en la espalda.  Tiene mucha debilidad en la pierna.  No puede controlar los intestinos o la vejiga.   Esta informacin no tiene Theme park managercomo fin reemplazar el consejo del mdico. Asegrese de hacerle al mdico  cualquier pregunta que tenga.   Document Released: 03/23/2005 Document Revised: 12/12/2014 Elsevier Interactive Patient Education [employer]2016 Elsevier Inc.     IF you received an x-ray today, you will receive an invoice from [facility]. Please contact [facility] at [phone] with questions or concerns regarding your invoice.   IF you received labwork today, you will receive an invoice from [facility]. Please contact [facility] at [phone] with questions or concerns regarding your invoice.   Our billing staff will not be able to assist you with questions regarding bills from these companies.  You will be contacted with the lab results as soon as they are available. The fastest way to get your results is to activate your My Chart account. Instructions are located on the last page of this paperwork. If you have not heard from us regarding the results in 2 weeks, please contact this office.

## 2015-09-13 NOTE — Progress Notes (Signed)
MRN: [id number] DOB: 01-28-1976  Subjective:   Ryan Robles is a 40 y.o. male presenting for chief complaint of Foot Pain  Reports longstanding history of intermittent right ankle pain. He has been diagnosed with arthritis of his right ankle. States that he has felt constant pain the last month. He also easily rolls his ankle. Has to compensate with his gait due to difficulty bearing weight which also causes him back pain at work. Has been using Advil, Tylenol, diclofenac gel with minimal relief. Denies trauma, swelling, bony deformity, redness.  Ryan Robles currently has no medications in their medication list. Also has No Known Allergies.  Ryan Robles  has a past medical history of GERD (gastroesophageal reflux disease) and NSAID induced gastritis (11/09/2014). Also  has past surgical history that includes Knee arthroscopy (2012).  Objective:   Vitals: BP 134/82 mmHg  Pulse 60  Temp(Src) 98 F (36.7 C) (Oral)  Resp 16  Wt 185 lb (83.915 kg)  SpO2 97%  Physical Exam  Constitutional: He is oriented to person, place, and time. He appears well-developed and well-nourished.  Cardiovascular: Normal rate.   Pulmonary/Chest: Effort normal.  Musculoskeletal:       Right ankle: He exhibits swelling (trace). He exhibits normal range of motion, no ecchymosis, no deformity, no laceration and normal pulse. Tenderness. Lateral malleolus and medial malleolus tenderness found. No AITFL, no CF ligament, no posterior TFL, no head of 5th metatarsal and no proximal fibula tenderness found. Achilles tendon exhibits no pain and no defect.       Lumbar back: He exhibits normal range of motion, no tenderness, no bony tenderness, no swelling, no edema, no deformity, no laceration, no pain and no spasm.  Ambulates with a limp favoring right ankle.  Neurological: He is alert and oriented to person, place, and time. He has normal reflexes.  Skin: Skin is warm and dry.   Dg Lumbar Spine Complete  09/13/2015  CLINICAL  DATA:  40 year old male with a history of lumbar back pain EXAM: LUMBAR SPINE - COMPLETE 4+ VIEW COMPARISON:  05/14/2015, CT 04/26/2015 FINDINGS: Lumbar Spine: Lumbar vertebral elements maintain normal alignment without evidence of anterolisthesis, retrolisthesis, subluxation. No fracture line identified. Vertebral body heights maintained as well as disc space heights. No significant degenerative disc disease or endplate changes. No significant facet changes. Unremarkable appearance of the visualized abdomen. Oblique images demonstrate no displaced pars defect IMPRESSION: Negative for acute fracture or malalignment of the lumbar spine. Signed, Yvone NeuJaime S. Loreta AveWagner, DO [facility] Electronically Signed   By: Gilmer MorJaime  Wagner D.O.   On: 09/13/2015 17:16   Dg Ankle Complete Right  09/13/2015  CLINICAL DATA:  arthritis of right ankle, low back pain EXAM: RIGHT ANKLE - COMPLETE 3+ VIEW COMPARISON:  06/09/2014 FINDINGS: Mild spurring between the medial malleolus and the talus with mild subchondral cystic change in the dome of the talus medially. These degenerative changes are mildly progressive when compared to the prior study. No fracture or dislocation. IMPRESSION: Mild but progressive tibiotalar arthritic change Electronically Signed   By: Esperanza Heiraymond  Rubner M.D.   On: 09/13/2015 17:17    Assessment and Plan :   1. Arthritis of ankle, right 2. Right ankle pain - Progressive arthritis. Start short steroid course. Use meloxicam and Tramadol thereafter. Consider referral to PT or ortho if this problem persists. Patient agreed.  3. Low back pain without sciatica, unspecified back pain laterality - X-ray findings reassuring. Start conservative management.  Wallis BambergMario Severus Brodzinski, PA-C  [facility] [phone] 09/13/2015 4:55 PM

## 2015-09-21 ENCOUNTER — Ambulatory Visit (INDEPENDENT_AMBULATORY_CARE_PROVIDER_SITE_OTHER): Payer: BLUE CROSS/BLUE SHIELD | Admitting: Family Medicine

## 2015-09-21 VITALS — BP 118/80 | HR 70 | Temp 98.4°F | Resp 16 | Wt 186.0 lb

## 2015-09-21 DIAGNOSIS — R51 Headache: Secondary | ICD-10-CM | POA: Diagnosis not present

## 2015-09-21 DIAGNOSIS — M19071 Primary osteoarthritis, right ankle and foot: Secondary | ICD-10-CM | POA: Diagnosis not present

## 2015-09-21 DIAGNOSIS — R519 Headache, unspecified: Secondary | ICD-10-CM

## 2015-09-21 LAB — CBC
HCT: 49.1 % (ref 38.5–50.0)
Hemoglobin: 16.7 g/dL (ref 13.2–17.1)
MCH: 31.3 pg (ref 27.0–33.0)
MCHC: 34 g/dL (ref 32.0–36.0)
MCV: 92.1 fL (ref 80.0–100.0)
MPV: 8.9 fL (ref 7.5–12.5)
PLATELETS: 257 10*3/uL (ref 140–400)
RBC: 5.33 MIL/uL (ref 4.20–5.80)
RDW: 13.5 % (ref 11.0–15.0)
WBC: 8.1 10*3/uL (ref 3.8–10.8)

## 2015-09-21 MED ORDER — DICLOFENAC SODIUM 1 % TD GEL: 2.0000 g | Freq: Four times a day (QID) | TRANSDERMAL | Status: DC

## 2015-09-21 NOTE — Patient Instructions (Addendum)
Thank you for coming in today. Apply the gel to the ankle 4x daily as needed.  Use ace wrap.  Take tylenol or ibuprofen for headache.  It should get better soon.  Call or go to the ER if you develop a large red swollen joint with extreme pain or oozing puss.  Go to the emergency room if your headache becomes excruciating or you have weakness or numbness or uncontrolled vomiting.    General Headache Without Cause A headache is pain or discomfort felt around the head or neck area. The specific cause of a headache may not be found. There are many causes and types of headaches. A few common ones are:  Tension headaches.  Migraine headaches.  Cluster headaches.  Chronic daily headaches. HOME CARE INSTRUCTIONS  Watch your condition for any changes. Take these steps to help with your condition: Managing Pain  Take over-the-counter and prescription medicines only as told by your health care provider.  Lie down in a dark, quiet room when you have a headache.  If directed, apply ice to the head and neck area:  Put ice in a plastic bag.  Place a towel between your skin and the bag.  Leave the ice on for 20 minutes, 2-3 times per day.  Use a heating pad or hot shower to apply heat to the head and neck area as told by your health care provider.  Keep lights dim if bright lights bother you or make your headaches worse. Eating and Drinking  Eat meals on a regular schedule.  Limit alcohol use.  Decrease the amount of caffeine you drink, or stop drinking caffeine. General Instructions  Keep all follow-up visits as told by your health care provider. This is important.  Keep a headache journal to help find out what may trigger your headaches. For example, write down:  What you eat and drink.  How much sleep you get.  Any change to your diet or medicines.  Try massage or other relaxation techniques.  Limit stress.  Sit up straight, and do not tense your muscles.  Do not  use tobacco products, including cigarettes, chewing tobacco, or e-cigarettes. If you need help quitting, ask your health care provider.  Exercise regularly as told by your health care provider.  Sleep on a regular schedule. Get 7-9 hours of sleep, or the amount recommended by your health care provider. SEEK MEDICAL CARE IF:   Your symptoms are not helped by medicine.  You have a headache that is different from the usual headache.  You have nausea or you vomit.  You have a fever. SEEK IMMEDIATE MEDICAL CARE IF:   Your headache becomes severe.  You have repeated vomiting.  You have a stiff neck.  You have a loss of vision.  You have problems with speech.  You have pain in the eye or ear.  You have muscular weakness or loss of muscle control.  You lose your balance or have trouble walking.  You feel faint or pass out.  You have confusion.   This information is not intended to replace advice given to you by your health care provider. Make sure you discuss any questions you have with your health care provider.   Document Released: 03/23/2005 Document Revised: 12/12/2014 Document Reviewed: 07/16/2014 Elsevier Interactive Patient Education 2016 [employer].   Arthritis Arthritis is a term that is commonly used to refer to joint pain or joint disease. There are more than 100 types of arthritis. CAUSES The most  common cause of this condition is wear and tear of a joint. Other causes include:  Gout.  Inflammation of a joint.  An infection of a joint.  Sprains and other injuries near the joint.  A drug reaction or allergic reaction. In some cases, the cause may not be known. SYMPTOMS The main symptom of this condition is pain in the joint with movement. Other symptoms include:  Redness, swelling, or stiffness at a joint.  Warmth coming from the joint.  Fever.  Overall feeling of illness. DIAGNOSIS This condition may be diagnosed with a physical exam and  tests, including:  Blood tests.  Urine tests.  Imaging tests, such as MRI, X-rays, or a CT scan. Sometimes, fluid is removed from a joint for testing. TREATMENT Treatment for this condition may involve:  Treatment of the cause, if it is known.  Rest.  Raising (elevating) the joint.  Applying cold or hot packs to the joint.  Medicines to improve symptoms and reduce inflammation.  Injections of a steroid such as cortisone into the joint to help reduce pain and inflammation. Depending on the cause of your arthritis, you may need to make lifestyle changes to reduce stress on your joint. These changes may include exercising more and losing weight. HOME CARE INSTRUCTIONS Medicines  Take over-the-counter and prescription medicines only as told by your health care provider.  Do not take aspirin to relieve pain if gout is suspected. Activities  Rest your joint if told by your health care provider. Rest is important when your disease is active and your joint feels painful, swollen, or stiff.  Avoid activities that make the pain worse. It is important to balance activity with rest.  Exercise your joint regularly with range-of-motion exercises as told by your health care provider. Try doing low-impact exercise, such as:  Swimming.  Water aerobics.  Biking.  Walking. Joint Care  If your joint is swollen, keep it elevated if told by your health care provider.  If your joint feels stiff in the morning, try taking a warm shower.  If directed, apply heat to the joint. If you have diabetes, do not apply heat without permission from your health care provider.  Put a towel between the joint and the hot pack or heating pad.  Leave the heat on the area for 20-30 minutes.  If directed, apply ice to the joint:  Put ice in a plastic bag.  Place a towel between your skin and the bag.  Leave the ice on for 20 minutes, 2-3 times per day.  Keep all follow-up visits as told by your  health care provider. This is important. SEEK MEDICAL CARE IF:  The pain gets worse.  You have a fever. SEEK IMMEDIATE MEDICAL CARE IF:  You develop severe joint pain, swelling, or redness.  Many joints become painful and swollen.  You develop severe back pain.  You develop severe weakness in your leg.  You cannot control your bladder or bowels.   This information is not intended to replace advice given to you by your health care provider. Make sure you discuss any questions you have with your health care provider.   Document Released: 04/30/2004 Document Revised: 12/12/2014 Document Reviewed: 06/18/2014 Elsevier Interactive Patient Education 2016 [employer]    IF you received an x-ray today, you will receive an invoice from [facility]. Please contact [facility] at [phone] with questions or concerns regarding your invoice.   IF you received labwork today, you will receive an invoice  from [employer]. Please contact [facility] at [phone] with questions or concerns regarding your invoice.   Our billing staff will not be able to assist you with questions regarding bills from these companies.  You will be contacted with the lab results as soon as they are available. The fastest way to get your results is to activate your My Chart account. Instructions are located on the last page of this paperwork. If you have not heard from [place] regarding the results in 2 weeks, please contact this office.

## 2015-09-21 NOTE — Progress Notes (Signed)
Ryan GauzeJuan D Robles is a 40 y.o. male who presents to [facility] today for follow-up ankle pain. Patient was seen last week for ankle pain thought to be related to DJD. He was given a limited prednisone course meloxicam and tramadol. He notes he developed dizziness and lightheadedness and significant left-sided headache. He notes he has had change of vision in his left eye and has some pain radiating to his left arm along with some cramping. He notes he continues to have right ankle pain as well. He discontinued his medicines a few days ago however his headache symptoms and vision changes have persisted. No fevers or chills vomiting or diarrhea.     Past Medical History  Diagnosis Date  . GERD (gastroesophageal reflux disease)   . NSAID induced gastritis 11/09/2014   Past Surgical History  Procedure Laterality Date  . Knee arthroscopy  2012    left, meniscal repair and ligament repair   Social History  Substance Use Topics  . Smoking status: Never Smoker   . Smokeless tobacco: Never Used  . Alcohol Use: No   ROS as above Medications: Current Outpatient Prescriptions  Medication Sig Dispense Refill  . predniSONE (DELTASONE) 20 MG tablet Take 2 tablets daily with breakfast. 10 tablet 0  . meloxicam (MOBIC) 7.5 MG tablet Take 1-2 tablets daily with food. (Patient not taking: Reported on 09/21/2015) 30 tablet 6  . traMADol (ULTRAM) 50 MG tablet Take 1 tablet (50 mg total) by mouth every 8 (eight) hours as needed. (Patient not taking: Reported on 09/21/2015) 30 tablet 0   No current facility-administered medications for this visit.   No Known Allergies   Exam:  BP 118/80 mmHg  Pulse 70  Temp(Src) 98.4 F (36.9 C) (Oral)  Resp 16  Wt 186 lb (84.369 kg)  SpO2 98% Gen: Well NAD HEENT: EOMI,  MMM PERRL normal funduscopic exam bilaterally. Lungs: Normal work of breathing. CTABL Heart: RRR no MRG Abd: NABS, Soft. Nondistended, Nontender Exts: Brisk capillary refill, warm and well  perfused.  Neuro: Alert and oriented normal normal speech thought process and affect. Normal gait and coordination reflexes strength and sensation.   Visual Acuity:  Right Eye Distance:   20/15 Left Eye Distance:   20/25 Bilateral Distance:   20/20  Right Ankle Injection:  Consent obtained and timeout performed. Warned about potential risks including but not limited to infection skin hypopigmented fat atrophy. Skin cleaned with alcohol and cold spray applied and 3 mL of lidocaine without epinephrine and 40 mg of Depo-Medrol were injected into the lateral ankle joint Recent tolerated procedure well  X-ray right ankle dated 09/13/2014 CLINICAL DATA: arthritis of right ankle, low back pain  EXAM: RIGHT ANKLE - COMPLETE 3+ VIEW  COMPARISON: 06/09/2014  FINDINGS: Mild spurring between the medial malleolus and the talus with mild subchondral cystic change in the dome of the talus medially. These degenerative changes are mildly progressive when compared to the prior study. No fracture or dislocation.  IMPRESSION: Mild but progressive tibiotalar arthritic change   Electronically Signed  By: Esperanza Heiraymond Rubner M.D.  On: 09/13/2015 17:17  No results found for this or any previous visit (from the past 24 hour(s)). No results found.  Assessment and Plan: 40 y.o. male with  1) right ankle pain. DJD. Treat with injection today. Additionally use topical diclofenac. Follow up with sports medicine return to clinic as needed. 2) additionally patient notes headache. I'm not quite sure of the etiology. His vital signs are normal and his  neurological exam was also normal. I suspect is probably an adverse reaction not one of the medications that were recently prescribed. He has discontinued these medications and I suspect she'll improve. His visual acuity was normal today and his funduscopic exam was normal. Plan for watchful waiting.  Discussed warning signs or symptoms. Please see  discharge instructions. Patient expresses understanding.

## 2015-09-22 LAB — COMPREHENSIVE METABOLIC PANEL
ALBUMIN: 4.6 g/dL (ref 3.6–5.1)
ALK PHOS: 65 U/L (ref 40–115)
ALT: 44 U/L (ref 9–46)
AST: 27 U/L (ref 10–40)
BUN: 21 mg/dL (ref 7–25)
CO2: 25 mmol/L (ref 20–31)
CREATININE: 0.9 mg/dL (ref 0.60–1.35)
Calcium: 9.7 mg/dL (ref 8.6–10.3)
Chloride: 101 mmol/L (ref 98–110)
Glucose, Bld: 97 mg/dL (ref 65–99)
POTASSIUM: 4.3 mmol/L (ref 3.5–5.3)
Sodium: 138 mmol/L (ref 135–146)
TOTAL PROTEIN: 7.5 g/dL (ref 6.1–8.1)
Total Bilirubin: 0.6 mg/dL (ref 0.2–1.2)

## 2015-09-25 ENCOUNTER — Encounter: Payer: Self-pay | Admitting: *Deleted

## 2015-10-05 DIAGNOSIS — Z0100 Encounter for examination of eyes and vision without abnormal findings: Secondary | ICD-10-CM | POA: Diagnosis not present

## 2015-10-14 DIAGNOSIS — H35713 Central serous chorioretinopathy, bilateral: Secondary | ICD-10-CM | POA: Diagnosis not present

## 2015-11-23 ENCOUNTER — Telehealth: Payer: Self-pay

## 2015-11-23 NOTE — Telephone Encounter (Signed)
Patient states that Dr. Allyne GeeSanders wants to speak with Ryan Robles because one of the medications prescribed is causing problems with the patient's vision. Please call Dr. Stephannie LiJason Sanders at [facility] [phone]

## 2015-11-25 NOTE — Telephone Encounter (Signed)
I returned call to Dr. Allyne GeeSanders office. He is out on vacation and was told that he would get in touch with me when he returned.

## 2015-12-18 DIAGNOSIS — H35713 Central serous chorioretinopathy, bilateral: Secondary | ICD-10-CM | POA: Diagnosis not present

## 2015-12-25 ENCOUNTER — Ambulatory Visit (INDEPENDENT_AMBULATORY_CARE_PROVIDER_SITE_OTHER): Payer: BLUE CROSS/BLUE SHIELD | Admitting: Physician Assistant

## 2015-12-25 VITALS — BP 118/80 | HR 62 | Temp 97.9°F | Resp 17 | Ht 67.0 in | Wt 182.0 lb

## 2015-12-25 DIAGNOSIS — Z0000 Encounter for general adult medical examination without abnormal findings: Secondary | ICD-10-CM | POA: Diagnosis not present

## 2015-12-25 DIAGNOSIS — Z23 Encounter for immunization: Secondary | ICD-10-CM | POA: Diagnosis not present

## 2015-12-25 DIAGNOSIS — M19071 Primary osteoarthritis, right ankle and foot: Secondary | ICD-10-CM | POA: Diagnosis not present

## 2015-12-25 DIAGNOSIS — E785 Hyperlipidemia, unspecified: Secondary | ICD-10-CM | POA: Diagnosis not present

## 2015-12-25 NOTE — Progress Notes (Signed)
Ryan Robles  MRN: [id number] DOB: 01-30-1976  Subjective:  Patient is a 40 y.o. male who presents for annual physical exam.  Diet: Pt notes that he has been eating more lately. He likes to eat rice, beans, chicken, pork, and some fish. He started taking fish oil after last visit and notes that it makes him feel better. He eats three meals a day. He likes to drink sodas, sweet tea, and water.   Exercise: Pt is not participating in any structured physical activity. Notes that he has arthritis and that this inhibits him from working out. He is pretty active at work.   Social: He works for [employer]. [employer]. Lives at home wife and three kids. Pt is sexually active. In a monogamous relationship with wife.  Sleep: Gets about 5-6 hours of sleep a night. Feels rested throughout the day.   Bowel movements: Regular. Has not has any more trouble with hemorrhoids since last annual exam.   Last dental exam: 10/2015 Last vision exam: 10/2015 Vaccinations      Tetanus: 06/05/2011     Patient Active Problem List   Diagnosis Date Noted  . Headache 09/21/2015  . 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 Nephrolithiasis 04/25/2015  . Osteoarthritis of ankle or foot 11/09/2014    Current Outpatient Prescriptions on File Prior to Visit  Medication Sig Dispense Refill  . diclofenac sodium (VOLTAREN) 1 % GEL Apply 2 g topically 4 (four) times daily. To affected joint. 100 g 11   No current facility-administered medications on file prior to visit.     No Known Allergies  Social History   Social History  . Marital status: Married    Spouse name: N/A  . Number of children: N/A  . Years of education: N/A   Occupational History  . demolition    Social History Main Topics  . Smoking status: Never Smoker  . Smokeless tobacco: Never Used  . Alcohol use No  . Drug use: No  . Sexual activity: Yes    Birth control/ protection: Condom   Other Topics Concern  . 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 Cuts grass.        Seatbelt:  100%      Guns:  none    Past Surgical History:  Procedure Laterality Date  . KNEE ARTHROSCOPY  2012   left, meniscal repair and ligament repair    No family history on file.  Review of Systems  Constitutional: Negative.   HENT: Negative.   Eyes: Positive for discharge (went to urgent care today, recieved eye drops, feeling better).  Respiratory: Negative.   Cardiovascular: Negative.   Gastrointestinal: Negative.   Endocrine: Negative.   Genitourinary: Negative.   Musculoskeletal: Positive for arthralgias (has had ankle pain for the past year).  Skin: Negative.   Allergic/Immunologic: Negative.   Neurological: Negative.   Hematological: Negative.   Psychiatric/Behavioral: Negative.    Objective:  BP 118/80 (BP Location: Right Arm, Patient Position: Sitting, Cuff Size: Normal)   Pulse 62  Temp 97.9 F (36.6 C) (Oral)   Resp 17   Ht 5\' 7"  (1.702 m)   Wt 182 lb (82.6 kg)   SpO2 97%   BMI 28.51 kg/m   Physical Exam  Constitutional: He is oriented to person, place, and time and well-developed, well-nourished, and in no distress.  HENT:  Head: Normocephalic and atraumatic.  Right Ear: Hearing, tympanic membrane, external ear and ear canal normal.  Left Ear: Hearing, tympanic membrane, external ear and ear canal normal.  Nose: Nose normal.  Mouth/Throat: Uvula is midline, oropharynx is clear and moist and mucous membranes are normal. No oropharyngeal exudate.  Eyes: Conjunctivae and EOM are normal. Pupils are equal, round, and reactive to light.  Neck: Trachea normal and normal range of motion.  Cardiovascular: Normal rate, regular rhythm, normal  heart sounds and intact distal pulses.   Pulmonary/Chest: Effort normal and breath sounds normal.  Abdominal: Soft. Normal appearance and bowel sounds are normal.  Musculoskeletal: Normal range of motion.       Right ankle: Tenderness ( bandaid in place over medial aspect of right ankle from cortisone injection earlier this day at orthopedic clinic ).  Lymphadenopathy:       Head (right side): No submental, no submandibular, no tonsillar, no preauricular, no posterior auricular and no occipital adenopathy present.       Head (left side): No submental, no submandibular, no tonsillar, no preauricular, no posterior auricular and no occipital adenopathy present.    He has no cervical adenopathy.       Right: No supraclavicular adenopathy present.       Left: No supraclavicular adenopathy present.  Neurological: He is alert and oriented to person, place, and time. He has normal sensation, normal strength and normal reflexes. Gait normal.  Skin: Skin is warm and dry.  Psychiatric: Affect normal.  Vitals reviewed.   No exam data present  Assessment and Plan :  Discussed healthy lifestyle, diet, exercise, preventative care, vaccinations, and addressed patient's concerns. Plan for follow up in one year. Otherwise, plan for specific conditions below.  1. Encounter for annual health examination 2. Influenza vaccine needed - Flu Vaccine QUAD 36+ mos IM  3. Elevated lipids Await results  - Lipid panel   Benjiman CoreBrittany Wiseman PA-C  [facility] 12/25/2015 6:08 PM

## 2015-12-25 NOTE — Patient Instructions (Addendum)
Start trying to decrease the amount of sodas you are drinking each day and supplement with water. Start engaging in at least 20-30 minutes of walking daily to help with weight.   Use ice to ankles after walking to help decrease pain.  Thank you for letting me participate in your health and well being.  IF you received an x-ray today, you will receive an invoice from [facility]. Please contact [facility] at [phone] with questions or concerns regarding your invoice.   IF you received labwork today, you will receive an invoice from [facility]. Please contact [facility] at [phone] with questions or concerns regarding your invoice.   Our billing staff will not be able to assist you with questions regarding bills from these companies.  You will be contacted with the lab results as soon as they are available. The fastest way to get your results is to activate your My Chart account. Instructions are located on the last page of this paperwork. If you have not heard from us regarding the results in 2 weeks, please contact this office.

## 2015-12-26 LAB — LIPID PANEL
CHOLESTEROL: 202 mg/dL — AB (ref 125–200)
HDL: 38 mg/dL — AB (ref >=40)
LDL CALC: 146 mg/dL — AB (ref <130)
TRIGLYCERIDES: 90 mg/dL (ref <150)
Total CHOL/HDL Ratio: 5.3 Ratio — ABNORMAL HIGH (ref <=5.0)
VLDL: 18 mg/dL (ref <30)

## 2016-02-07 ENCOUNTER — Ambulatory Visit (INDEPENDENT_AMBULATORY_CARE_PROVIDER_SITE_OTHER): Payer: BLUE CROSS/BLUE SHIELD | Admitting: Physician Assistant

## 2016-02-07 ENCOUNTER — Ambulatory Visit (INDEPENDENT_AMBULATORY_CARE_PROVIDER_SITE_OTHER): Payer: BLUE CROSS/BLUE SHIELD

## 2016-02-07 VITALS — BP 104/78 | HR 54 | Temp 98.5°F | Resp 17 | Ht 67.0 in | Wt 188.0 lb

## 2016-02-07 DIAGNOSIS — M25562 Pain in left knee: Secondary | ICD-10-CM | POA: Diagnosis not present

## 2016-02-07 DIAGNOSIS — M179 Osteoarthritis of knee, unspecified: Secondary | ICD-10-CM | POA: Diagnosis not present

## 2016-02-07 DIAGNOSIS — Z8739 Personal history of other diseases of the musculoskeletal system and connective tissue: Secondary | ICD-10-CM | POA: Diagnosis not present

## 2016-02-07 DIAGNOSIS — G8929 Other chronic pain: Secondary | ICD-10-CM

## 2016-02-07 NOTE — Progress Notes (Signed)
02/10/2016 1:17 PM   DOB: October 23, 1975 / MRN: [id number]  SUBJECTIVE:  Ryan Robles is a 40 y.o. male presenting for left knee pain.  Reports this started a few years ago and he states "I have arthritis in both knees."  Reports the pain is improved in the morning and worsens the more he stands.  Has a birthday party for his daughter in the next week and has had cortisone injection of the other knee which resolved his pain for about a year.  He denies a history of diabetes.  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  No new sexual partners.  No new trauma to the knee.  Denies a family hx of rheumatological disease.   He has No Known Allergies.   He  has a past medical history of GERD (gastroesophageal reflux disease) and NSAID induced gastritis (11/09/2014).    He  reports that he has never smoked. He has never used smokeless tobacco. He reports that he does not drink alcohol or use drugs. He  reports that he currently engages in sexual activity. He reports using the following method of birth control/protection: Condom. The patient  has a past surgical history that includes Knee arthroscopy (2012).  His family history is not on file.  Review of Systems  Constitutional: Negative for fever and weight loss.  Respiratory: Negative for cough.   Musculoskeletal: Positive for joint pain. Negative for back pain, falls, myalgias and neck pain.  Neurological: Negative for focal weakness.  Endo/Heme/Allergies: Negative for polydipsia.    The problem list and medications were reviewed and updated by myself where necessary and exist elsewhere in the encounter.   OBJECTIVE:  BP 104/78 (BP Location: Right Arm, Patient Position: Sitting, Cuff Size: Normal)   Pulse (!) 54   Temp 98.5 F (36.9 C) (Oral)   Resp 17   Ht 5\' 7"  (1.702 m)   Wt 188 lb (85.3 kg)   SpO2 97%   BMI 29.44 kg/m   Physical Exam  Constitutional: He is oriented to person, place, and time. He appears  well-developed. He does not appear ill.  Eyes: Conjunctivae and EOM are normal. Pupils are equal, round, and reactive to light.  Cardiovascular: Normal rate.   Pulmonary/Chest: Effort normal.  Abdominal: He exhibits no distension.  Musculoskeletal: Normal range of motion.       Left knee: He exhibits bony tenderness. Tenderness found. Medial joint line and lateral joint line tenderness noted.  Neurological: He is alert and oriented to person, place, and time. No cranial nerve deficit. Coordination normal.  Skin: Skin is warm and dry. He is not diaphoretic.  Psychiatric: He has a normal mood and affect.  Nursing note and vitals reviewed.   Results for orders placed or performed in visit on 02/07/16 (from the past 72 hour(s))  Sedimentation Rate     Status: None   Collection Time: 02/07/16  6:25 PM  Result Value Ref Range   Sed Rate 1 0 - 15 mm/hr    Dg Knee 1-2 Views Left  Result Date: 02/07/2016 CLINICAL DATA:  Pain EXAM: LEFT KNEE - 1-2 VIEW COMPARISON:  None. FINDINGS: Mild narrowing of the lateral compartment with spurring. Mild narrowing of the medial compartment. No fracture. Small superior patellar spur. Small suprapatellar joint effusion. IMPRESSION: Minimal degenerative changes. Probable small suprapatellar effusion. No acute osseous abnormality. Electronically Signed   By: Jasmine PangKim  Fujinaga M.D.   On: 02/07/2016 18:47   Procedure:  Risk and benefits discussed and verbal consent obtained.  Betadine prep x 3, sterile prep and drape.  20 gauge needle inserted into the left suprapatellar compartment and 1 cc of kenalog along with 3 cc of Marcaine pushed into the knee without resistance.  Bandage applied.  Patient tolerated procedure without complaint.   ASSESSMENT AND PLAN  Ryan Robles was seen today for knee pain and rash.  Diagnoses and all orders for this visit:  Chronic pain of left knee: See rads.  History of arthritis with a small effusion.  I have injected 1 cc of knenalog into the  suprapatellar compartment along with 3 cc of marcaine.  Patient was symptom free upon leaving the office.   -     DG Knee 1-2 Views Left; Future  History of arthritis -     Sedimentation Rate    The patient is advised to call or return to clinic if he does not see an improvement in symptoms, or to seek the care of the closest emergency department if he worsens with the above plan.   Deliah BostonMichael Jese Comella, MHS, PA-C [facility] 02/10/2016 1:17 PM

## 2016-02-07 NOTE — Patient Instructions (Signed)
     IF you received an x-ray today, you will receive an invoice from [facility]. Please contact [facility] at [phone] with questions or concerns regarding your invoice.   IF you received labwork today, you will receive an invoice from [facility]. Please contact [facility] at [phone] with questions or concerns regarding your invoice.   Our billing staff will not be able to assist you with questions regarding bills from these companies.  You will be contacted with the lab results as soon as they are available. The fastest way to get your results is to activate your My Chart account. Instructions are located on the last page of this paperwork. If you have not heard from us regarding the results in 2 weeks, please contact this office.      

## 2016-02-08 LAB — SEDIMENTATION RATE: Sed Rate: 1 mm/hr (ref 0–15)

## 2016-10-25 IMAGING — US US ABDOMEN COMPLETE
1 series · 14 of 25 positions shown · non-contrast
Comparison: CT abdomen and pelvis 04/26/2015.

CLINICAL DATA: Elevated alkaline phosphatase. Right flank pain and
hematuria.

EXAM:
ABDOMEN ULTRASOUND COMPLETE

[Series 1: us abdomen complete · 0.30mm/px · 14 of 79 slices shown]
[im 1/79]
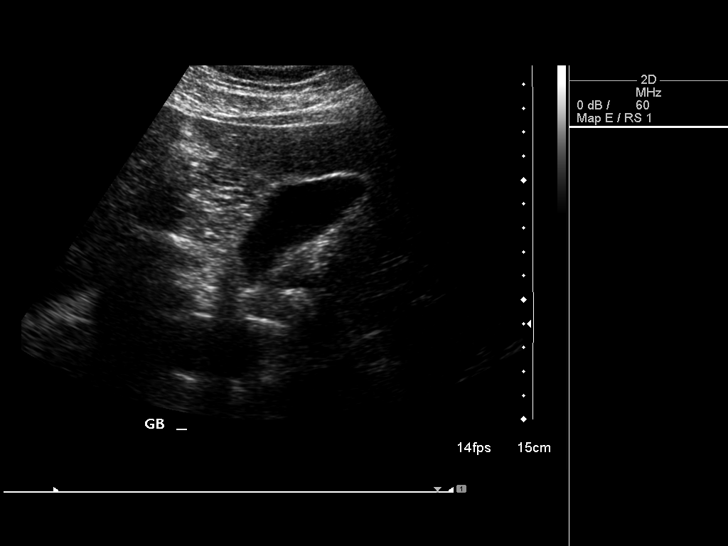
[im 7/79]
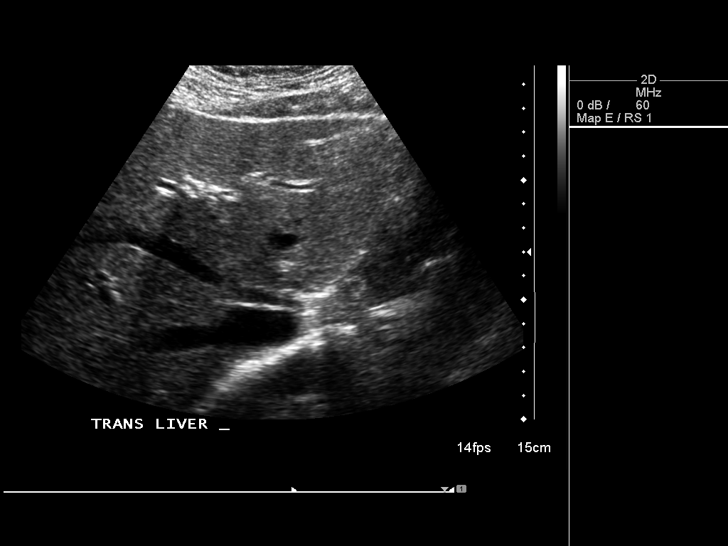
[im 14/79]
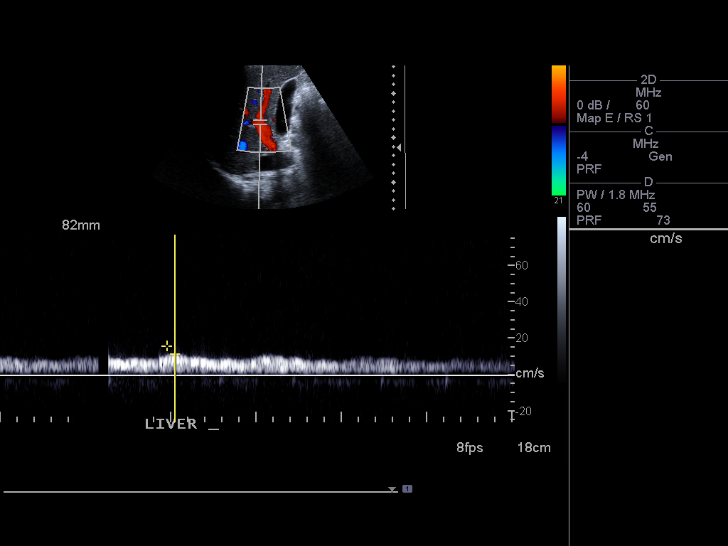
[im 20/79]
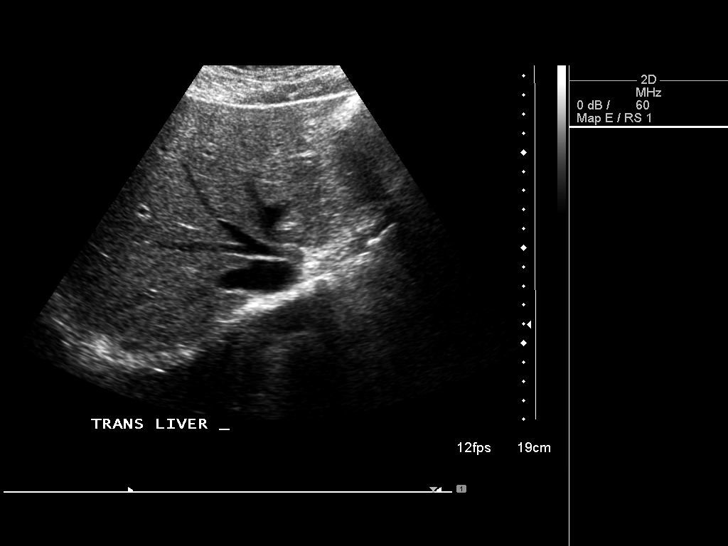
[im 27/79]
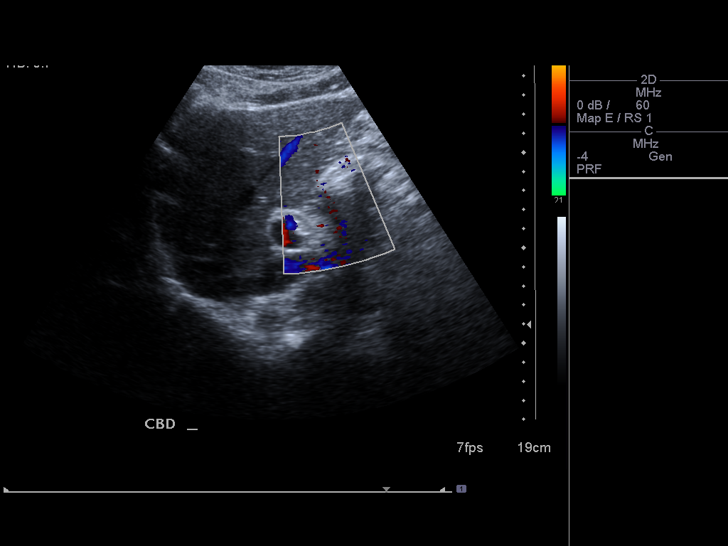
[im 30/79]
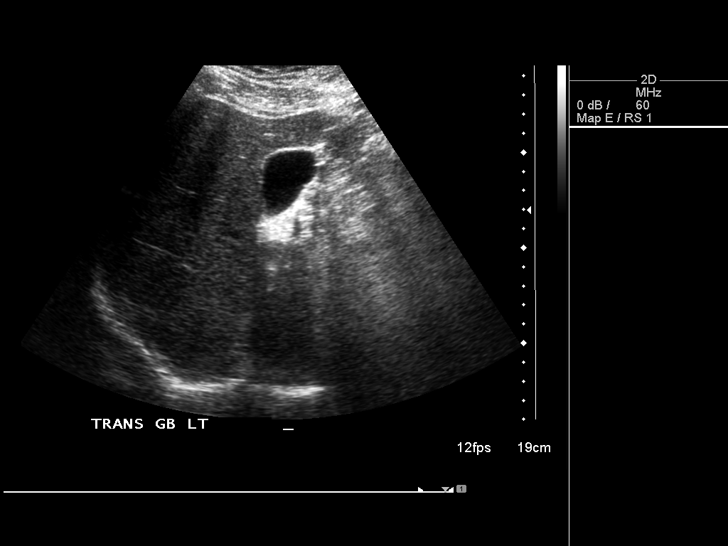
[im 36/79]
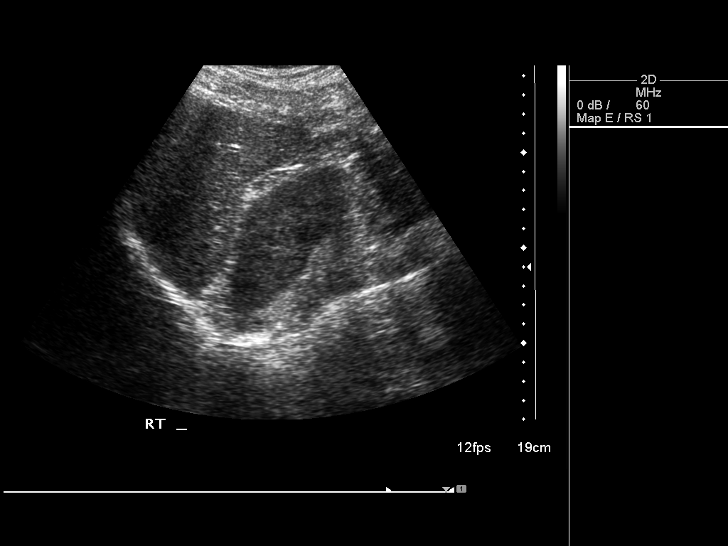
[im 43/79]
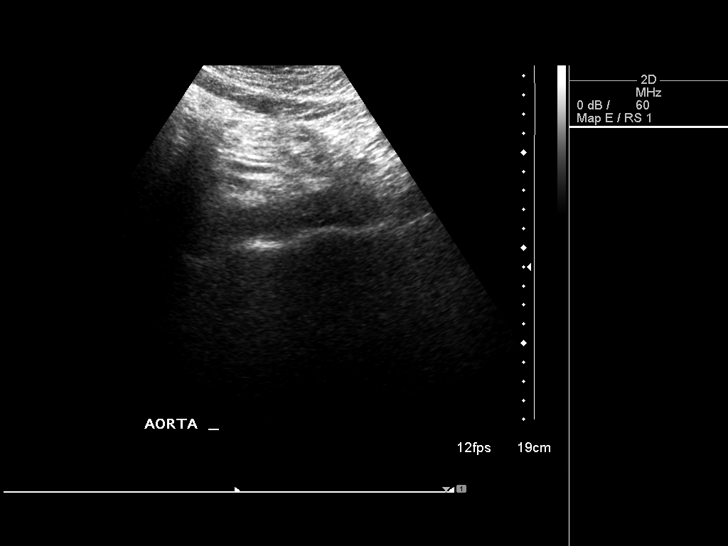
[im 49/79]
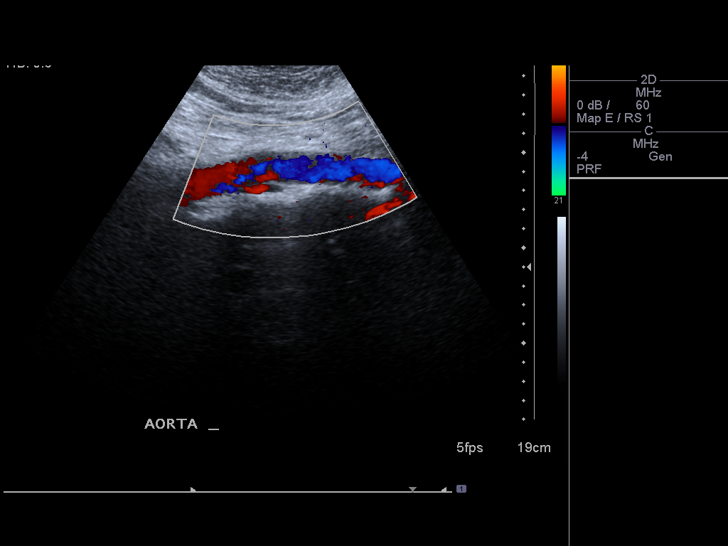
[im 53/79]
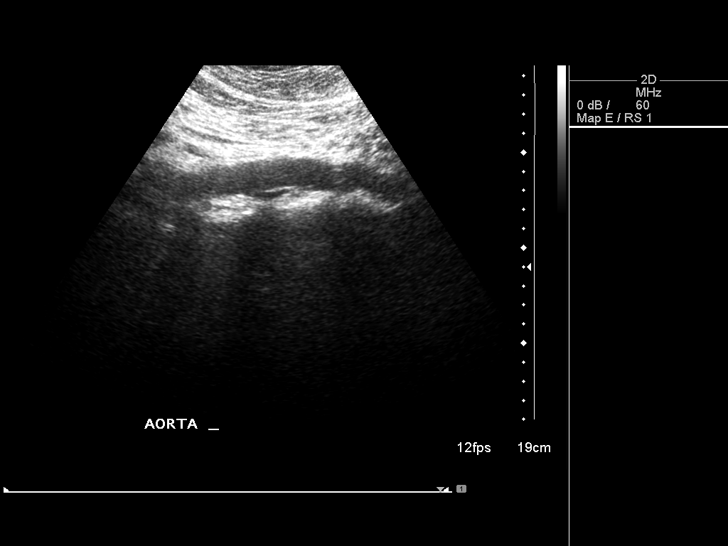
[im 59/79]
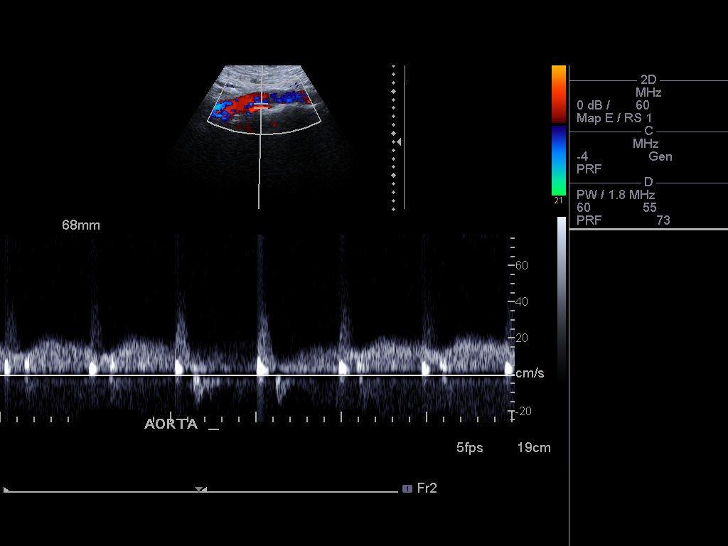
[im 66/79]
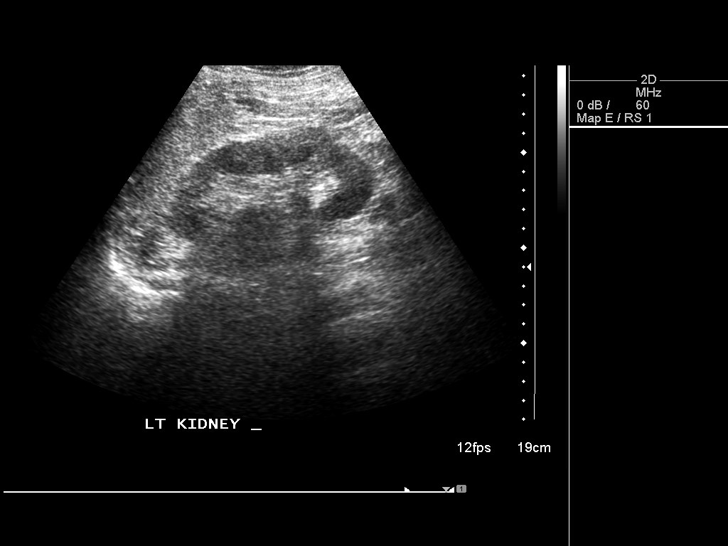
[im 72/79]
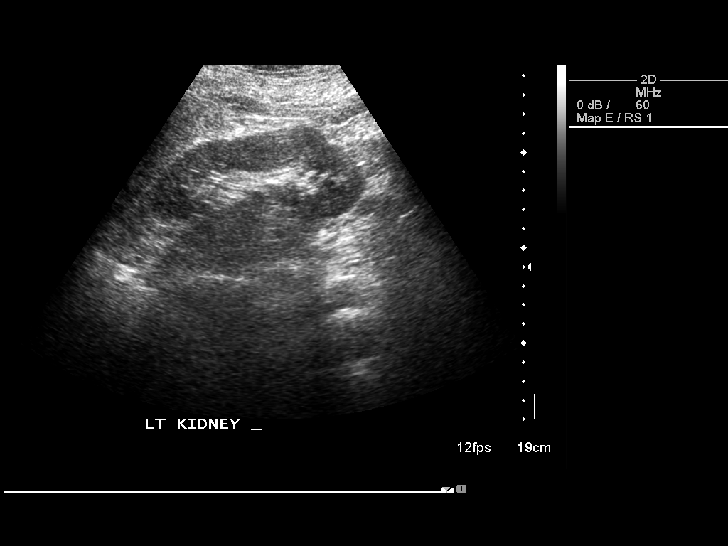
[im 79/79]
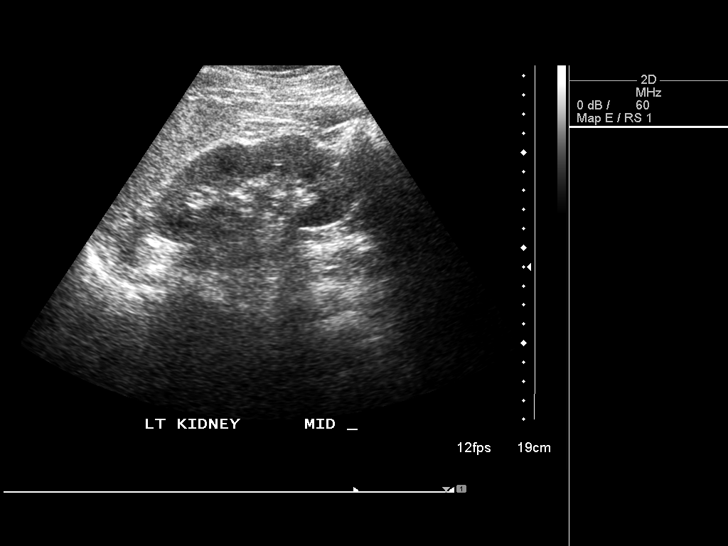

[14 of 25 positions shown; findings below may reference images not displayed]

FINDINGS: Gallbladder: No gallstones or wall thickening visualized. No
sonographic Murphy sign noted by sonographer.

Common bile duct: Diameter: 0.5 cm.

Liver: No focal lesion identified. Within normal limits in
parenchymal echogenicity.

IVC: No abnormality visualized.

Pancreas: Visualized portion unremarkable.

Spleen: Size and appearance within normal limits.

Right Kidney: Length: 11.3 cm. Echogenicity within normal limits. No
mass or hydronephrosis visualized.

Left Kidney: Length: 11.3 cm. Echogenicity within normal limits. No
mass or hydronephrosis visualized.

Abdominal aorta: No aneurysm visualized.

Other findings: None.
IMPRESSION: Negative examination.

## 2016-12-25 ENCOUNTER — Encounter: Payer: Self-pay | Admitting: Family Medicine

## 2016-12-25 NOTE — Progress Notes (Addendum)
Subjective:    Patient ID: Ryan Robles, male    DOB: Oct 07, 1975, 41 y.o.   MRN: [id number] Chief Complaint  Patient presents with  . Annual Exam    knee and back     HPI  [name] is a delightful 41 yo male who is here today for his complete physical.  He comes in annually for his CPE every Sept but is always seen by a different provider. We used the Skype Spanish Interpretor though pt speaks some [place].  Primary Preventative Screenings: Prostate Cancer: Seen at [facility] prior STI screening: In monogamous relationship w/ wife. HIV and hep A/B/C neg 2016 Colorectal Cancer: Tobacco use/AAA/Lung/EtOH/Illicit substances: None Cardiac: none prior Weight/Blood sugar/Diet/Exercise: active at work but has arthritis in his ankle/foot and h/o left knee meniscal/ligament inj that prevents formal exercise.  Seeing ortho for this. OTC/Vit/Supp/Herbal: fish oil 1 tab a day but sometimes forgets Dentist/Optho: annual - states she started having eye problems due to a medication he was rx'd here prior but now resolved. Immunizations:  Immunization History  Administered Date(s) Administered  . Influenza Split 12/17/2011  . Influenza,inj,Quad PF,6+ Mos 12/28/2013, 01/12/2015, 12/25/2015  . Td 06/05/2011     Chronic Medical Conditions: Arthritis in Right ankle, B knees: 02/2016 Left knee xray showed minimal deg change. 6/17  Was seeing Ryan Robles last yr after his visit here in Nov but didn't go back as he really didn't get much information from them. H/o cortisone inj to all joints which help sig. Will take advil when it hurts a lot which helps a little.  Was given a medication here that caused eye problems - had to see optho.  HLD: advised tlc as LDL 146, non-HDL 164 H/o hematuria - resolved H/o + H. Pylori 11/2014  Occasional pain with defecation. Denies constipation/diarrhea. Stools daily and normal consistency. No blood - no hematochezia or melana. H/o hemorrhoids  resolved.  Past Medical History:  Diagnosis Date  . GERD (gastroesophageal reflux disease) 11/2014   + H. pylori  . NSAID induced gastritis 11/09/2014   Past Surgical History:  Procedure Laterality Date  . KNEE ARTHROSCOPY  2012   left, meniscal repair and ligament repair   No current outpatient prescriptions on file prior to visit.   No current facility-administered medications on file prior to visit.    No Known Allergies History reviewed. No pertinent family history. Social History   Social History  . Marital status: Married    Spouse name: N/A  . Number of children: N/A  . Years of education: N/A   Occupational History  . demolition    Social History Main Topics  . Smoking status: Never Smoker  . Smokeless tobacco: Never Used  . Alcohol use No  . Drug use: No  . Sexual activity: Yes    Birth control/ protection: Condom   Other Topics Concern  . 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 Cuts grass.        Seatbelt:  100%      Guns:  none  Depression screen Ryan Robles 2/9 12/26/2016 02/07/2016 12/25/2015 09/21/2015 09/13/2015  Decreased Interest 0 0 0 0 0  Down, Depressed, Hopeless 0 0 0 0 0  PHQ - 2 Score 0 0 0 0 0     Review of Systems  All other systems reviewed and are negative. unless otherwise noted in HPI    Objective:   Physical Exam  Constitutional: He is oriented to person, place, and time. He appears well-developed and well-nourished. No distress.  HENT:  Head: Normocephalic and atraumatic.  Right Ear: Tympanic membrane, external ear and ear canal normal.  Left Ear: Tympanic membrane, external ear and ear canal normal.  Nose: Nose normal.  Mouth/Throat: Uvula is  midline, oropharynx is clear and moist and mucous membranes are normal. No oropharyngeal exudate.  Eyes: Conjunctivae are normal. Right eye exhibits no discharge. Left eye exhibits no discharge. No scleral icterus.  Neck: Normal range of motion. Neck supple. No thyromegaly present.  Cardiovascular: Normal rate, regular rhythm, normal heart sounds and intact distal pulses.   Pulmonary/Chest: Effort normal and breath sounds normal. No respiratory distress.  Abdominal: Soft. Bowel sounds are normal. He exhibits no distension and no mass. There is no tenderness. There is no rebound and no guarding.  Genitourinary: Rectal exam shows external hemorrhoid (moderate non-inflammed/enlarged hemorrhoid at 12 oclock - most proximal to perineum). Rectal exam shows no fissure, no mass and no tenderness.  Musculoskeletal: He exhibits no edema.  Lymphadenopathy:    He has no cervical adenopathy.  Neurological: He is alert and oriented to person, place, and time. He has normal reflexes. No cranial nerve deficit. He exhibits normal muscle tone.  Skin: Skin is warm and dry. No rash noted. He is not diaphoretic. No erythema.  Psychiatric: He has a normal mood and affect. His behavior is normal.     Visual Acuity Screening   Right eye Left eye Both eyes  Without correction:  With correction:        BP 113/68   Pulse (!) 51   Temp 98.3 F (36.8 C) (Oral)   Resp 16   Ht  (1.676 m)   Wt 179 lb 3.2 oz (81.3 kg)   SpO2 98%   BMI 28.92 kg/m      Results for orders placed or performed in visit on 12/26/16  POCT urinalysis dipstick  Result Value Ref Range   Color, UA yellow yellow   Clarity, UA clear clear   Glucose, UA negative negative mg/dL   Bilirubin, UA negative negative   Ketones, POC UA trace (5) (A) negative mg/dL   Spec Grav, UA >=1.610 (A) 1.010 - 1.025   Blood, UA negative negative   pH, UA 6.5 5.0 - 8.0   Protein Ur, POC trace (A) negative mg/dL   Urobilinogen, UA  0.2 0.2 or 1.0 E.U./dL   Nitrite, UA Negative Negative   Leukocytes, UA Negative Negative  POCT Microscopic Urinalysis ([facility])  Result Value Ref Range   WBC,UR,HPF,POC Few (A) None WBC/hpf   RBC,UR,HPF,POC None None RBC/hpf   Bacteria None None, Too numerous to count   Mucus Absent Absent   Epithelial Cells, UR Per Microscopy Moderate (A) None, Too numerous to count cells/hpf    Assessment & Plan:  Flu shot Ua, umicro, cbc, cmp, lipid, tsh  1. Annual physical exam   2. Routine screening for STI (sexually transmitted infection)   3. Screening for cardiovascular, respiratory, and genitourinary diseases   4. Screening for deficiency anemia  5. Screening for thyroid disorder   6. Hematuria, unspecified type   7. Pure hypercholesterolemia   8. Osteoarthritis of right ankle or foot - try a joint supplement like high dose glucosamine-chondroiton or tumeric/circumin  9. Need for prophylactic vaccination and inoculation against influenza    Start a daily fiber supp to avoid constipation to prevent exacerbation of chronic hemorrhoids - not currently flaired.    Biometric form completed and returned to pt during visit.  Orders Placed This Encounter  Procedures  . Flu Vaccine QUAD 36+ mos IM  . CBC with Differential/Platelet  . Comprehensive metabolic panel    Order Specific Question:   Has the patient fasted?    Answer:   Yes  . Lipid panel    Order Specific Question:   Has the patient fasted?    Answer:   Yes  . TSH  . POCT urinalysis dipstick  . POCT Microscopic Urinalysis ([facility])     Norberto Sorenson, M.D.  Primary Care at [facility] [address] [place], [place] [address] [phone] phone [phone] fax  12/29/16 10:13 AM

## 2016-12-26 ENCOUNTER — Ambulatory Visit (INDEPENDENT_AMBULATORY_CARE_PROVIDER_SITE_OTHER): Payer: BLUE CROSS/BLUE SHIELD | Admitting: Family Medicine

## 2016-12-26 ENCOUNTER — Encounter: Payer: Self-pay | Admitting: Family Medicine

## 2016-12-26 VITALS — BP 113/68 | HR 51 | Temp 98.3°F | Resp 16 | Ht 66.0 in | Wt 179.2 lb

## 2016-12-26 DIAGNOSIS — R319 Hematuria, unspecified: Secondary | ICD-10-CM | POA: Diagnosis not present

## 2016-12-26 DIAGNOSIS — Z113 Encounter for screening for infections with a predominantly sexual mode of transmission: Secondary | ICD-10-CM

## 2016-12-26 DIAGNOSIS — Z130 Encounter for screening for diseases of the blood and blood-forming organs and certain disorders involving the immune mechanism: Secondary | ICD-10-CM | POA: Diagnosis not present

## 2016-12-26 DIAGNOSIS — Z23 Encounter for immunization: Secondary | ICD-10-CM

## 2016-12-26 DIAGNOSIS — M19071 Primary osteoarthritis, right ankle and foot: Secondary | ICD-10-CM | POA: Diagnosis not present

## 2016-12-26 DIAGNOSIS — E7800 Pure hypercholesterolemia, unspecified: Secondary | ICD-10-CM | POA: Diagnosis not present

## 2016-12-26 DIAGNOSIS — Z136 Encounter for screening for cardiovascular disorders: Secondary | ICD-10-CM

## 2016-12-26 DIAGNOSIS — Z0000 Encounter for general adult medical examination without abnormal findings: Secondary | ICD-10-CM

## 2016-12-26 DIAGNOSIS — Z1383 Encounter for screening for respiratory disorder NEC: Secondary | ICD-10-CM | POA: Diagnosis not present

## 2016-12-26 DIAGNOSIS — Z1389 Encounter for screening for other disorder: Secondary | ICD-10-CM

## 2016-12-26 DIAGNOSIS — Z1329 Encounter for screening for other suspected endocrine disorder: Secondary | ICD-10-CM

## 2016-12-26 LAB — POCT URINALYSIS DIP (MANUAL ENTRY)
Bilirubin, UA: NEGATIVE
Blood, UA: NEGATIVE
Glucose, UA: NEGATIVE mg/dL
LEUKOCYTES UA: NEGATIVE
Nitrite, UA: NEGATIVE
PH UA: 6.5 (ref 5.0–8.0)
UROBILINOGEN UA: 0.2 U/dL

## 2016-12-26 LAB — POC MICROSCOPIC URINALYSIS (UMFC): MUCUS RE: ABSENT

## 2016-12-26 NOTE — Patient Instructions (Addendum)
Start daily fiber supplement like metamucil or benefiber on a permanent basis.  Dieta rica en fibra (High-Fiber Diet) La fibra, tambin llamada fibra dietaria, es un tipo de carbohidrato que se encuentra en las frutas, las verduras, los cereales integrales y los frijoles. Una dieta rica en fibra puede tener muchos beneficios para la salud. El mdico puede recomendar una dieta rica en fibra para ayudar a:  Chief Strategy Officer. La fibra puede hacer que defeque con ms frecuencia.  Disminuir el nivel de colesterol.  Aliviar las hemorroides, la diverticulosis no complicada o el sndrome del intestino irritable.  Evitar comer en exceso como parte de un plan para bajar de peso.  Evitar cardiopatas, la diabetes tipo 2 y ciertos cnceres. EN QU CONSISTE EL PLAN? El consumo diario recomendado de fibra incluye lo siguiente:  38gramos para hombres menores de 50 aos.  30gramos para hombres mayores de [place].  25gramos para mujeres menores de 50 aos.  21gramos para mujeres mayores de [place]. Puede lograr el consumo diario recomendado de fibra si come una variedad de frutas, verduras, cereales y frijoles. El mdico tambin puede recomendar un suplemento de fibra si no es posible obtener suficiente fibra a travs de la dieta. QU DEBO SABER ACERCA DE LA DIETA RICA EN FIBRA?  La eficacia de los suplementos de [place] no ha sido estudiada [place], de modo que es mejor obtener fibra a travs de los alimentos.  Verifique siempre el contenido de fibra en la etiqueta de informacin nutricional de los alimentos preenvasados. Busque alimentos que contengan al menos 5gramos de fibra por porcin.  Consulte al nutricionista si tiene preguntas sobre algunos alimentos especficos relacionados con su enfermedad, especialmente si estos alimentos no se mencionan a continuacin.  Aumente el consumo diario de fibra en forma gradual. Aumentar demasiado rpido el consumo de fibra dietaria puede  provocar meteorismo, clicos o gases.  Beber abundante agua. El [place] a Geophysicist/field seismologist.  QU ALIMENTOS PUEDO COMER? Cereales Panes integrales. Multicereales. Avena. Arroz integral. Gypsy Decant. Trigo burgol. Mijo. Muffins de salvado. Palomitas de maz. Galletas de centeno. Verduras Batatas. Espinaca. Col rizada. Alcachofas. Repollo. Brcoli. Guisantes. Zanahorias. [place]. Frutas Frutos rojos. Peras. Manzanas. Naranjas Aguacates. Ciruelas y pasas. Higos secos. Carnes y otras fuentes de protenas Frijoles blancos, colorados, pintos y porotos de soja. Guisantes secos. Lentejas. Frutos secos y semillas. Lcteos Yogur fortificado con Research scientist (life sciences). Bebidas Leche de soja fortificada con Bjorn Loser. Jugo de naranja fortificado con Bjorn Loser. Otros Barras de [place]. Los artculos mencionados arriba pueden no ser Raytheon de las bebidas o los alimentos recomendados. Comunquese con el nutricionista para conocer ms opciones. QU ALIMENTOS NO SE RECOMIENDAN? Cereales Pan blanco. Pastas hechas con Webb Laws. Arroz blanco. Verduras Papas fritas. Verduras enlatadas. Verduras bien cocidas. Frutas Jugo de frutas. Frutas cocidas coladas. Carnes y [address] fuentes de protenas Cortes de carne con Holiday representative. Aves o pescados fritos. Lcteos Leche. Yogur. Queso crema. PPG Industries. Bebidas Gaseosas. Otros Tortas y pasteles. [place] y aceites. Los artculos mencionados arriba pueden no ser Raytheon de las bebidas y los alimentos que se Theatre stage manager. Comunquese con el nutricionista para obtener ms informacin. ALGUNOS CONSEJOS PARA INCLUIR ALIMENTOS RICOS EN FIBRA EN LA DIETA  Consuma una gran variedad de alimentos ricos en fibra.  Asegrese de que la mitad de todos los cereales consumidos cada da sean cereales integrales.  Reemplace los panes y cereales hechos de harina refinada o harina blanca por panes y cereales integrales.  Reemplace el arroz blanco por Harlon Ditty  integral, trigo  burgol o mijo.  Comience Medical laboratory scientific officer con un desayuno rico en [place], como un cereal que contenga al menos 5gramos de fibra por porcin.  Use guisantes en lugar de carne en las sopas, ensaladas o pastas.  Coma bocadillos ricos en fibra, como frutos rojos, verduras crudas, frutos secos o palomitas de maz.  Esta informacin no tiene Theme park manager el consejo del mdico. Asegrese de hacerle al mdico cualquier pregunta que tenga. Document Released: 03/23/2005 Document Revised: 07/15/2015 Document Reviewed: 09/05/2013 Elsevier Interactive Patient Education  2017 [employer].   Mantenimiento de la salud en los hombres (Health Maintenance, Male) Un estilo de vida saludable y los cuidados preventivos son importantes para la salud y Counsellor. Pregntele al mdico cul es el cronograma de exmenes peridicos adecuado para usted. QU DEBO SABER SOBRE EL PESO Y LA DIETA? Consuma una dieta saludable  Coma muchas verduras, frutas, cereales integrales, productos lcteos con bajo contenido de grasa y Associate Professor.  No consuma muchos alimentos de alto contenido de grasas slidas, azcares agregados o sal. Mantenga un peso saludable La actividad fsica habitual puede ayudarlo a Barista o mantener un peso saludable. Deber hacer lo siguiente:  Realizar al menos de actividad fsica por semana. El ejercicio debe aumentar la frecuencia cardaca y Development worker, international aid la transpiracin (ejercicio de [place]).  Hacer ejercicios de entrenamiento de fuerza por lo [employer] por semana. Controlarse los niveles de colesterol y lpidos en la sangre  Hgase anlisis de sangre para controlar los lpidos y el colesterol cada 5aos a partir de los 35aos. Si tiene un riesgo alto de Warehouse manager cardiopatas coronarias, debe comenzar a Assurant de [place] a los [place]. Es posible que Insurance underwriter los niveles de colesterol con mayor frecuencia si: ? Sus niveles de lpidos y  colesterol son altos. ? Es mayor de [id number]. ? Tiene un riesgo alto de tener cardiopatas coronarias. QU DEBO SABER SOBRE LAS PRUEBAS DE DETECCIN DEL CNCER? Muchos tipos de cncer se pueden detectar de manera temprana y a menudo prevenirse. Cncer de pulmn  Debe someterse a pruebas de deteccin de cncer de pulmn todos los aos en los siguientes casos: ? Si fuma actualmente y lo ha hecho durante por lo menos 30aos. ? Si fue fumador que dej el hbito en el trmino de los ltimos 15aos.  Hable con el mdico sobre las opciones en relacin con los estudios de deteccin, cundo debe comenzar a Actuary y con Engineer, structural. Cncer colorrectal  Generalmente, las pruebas de deteccin habituales del cncer colorrectal comienzan a los 50aos y deben repetirse cada 5 a 10aos hasta los 75aos. Es posible que tenga que hacerse las pruebas con mayor frecuencia si se detectan formas tempranas de plipos precancerosos o pequeos bultos. Sin embargo, el mdico podr aconsejarle que lo haga antes, si tiene factores de riesgo para el cncer de colon.  El mdico puede recomendarle que use kits de prueba caseros para Recruitment consultant oculta en la materia fecal.  Se puede usar una pequea cmara en el extremo de un tubo para examinar el colon (sigmoidoscopia o colonoscopia). Este estudio [employer] formas ms tempranas de Building services engineer. Cncer de prstata y de testculo  En funcin de la edad y del [place] de salud general, el mdico puede realizarle determinados estudios de deteccin del cncer de prstata y de testculo.  Hable con el mdico sobre cualquier sntoma o acerca de las inquietudes que tenga sobre el cncer de prstata o de testculo. Cncer de  piel  Revise la piel de la cabeza a los pies con regularidad.  Informe al mdico si aparecen nuevos lunares o si nota cambios en los que ya tiene, especialmente en estos casos: ? Si hay un cambio en el tamao, la forma o el color del  lunar. ? Si tiene un lunar que es ms grande que el tamao de una goma de Paramedic.  Siempre use pantalla solar. Aplquese pantalla solar de [place] y repetida a lo largo del Futures trader.  Use mangas y Automatic Data, un sombrero de ala ancha y gafas para el sol cuando est al Guadalupe Dawn, para protegerse. QU DEBO SABER SOBRE LAS CARDIOPATAS CORONARIAS, LA DIABETES Y LA HIPERTENSIN ARTERIAL?  Si usted tiene entre 18 y 39aos, debe medirse la presin arterial cada 3a 5aos. Si usted tiene 40aos o ms, debe medirse la presin arterial [employer]. Debe medirse la presin arterial dos veces: una vez cuando est en un hospital o una clnica y la otra vez cuando est en otro sitio. Registre el promedio de Johnson [employer]. Para controlar su presin arterial cuando no est en un hospital o Paulita Cradle, puede usar lo siguiente: ? Valere Dross automtica para medir la presin arterial en una farmacia. ? Un monitor para medir la presin arterial en el hogar.  Hable con el mdico Lowe's Companies ideales de la presin arterial.  Si tiene entre 45 y 79aos, consltele al mdico si debe tomar aspirina para evitar las cardiopatas coronarias.  Hgase anlisis habituales de deteccin de la diabetes; para ello, contrlese la glucemia en ayunas. ? Si su peso es normal y tiene un bajo riesgo de padecer diabetes, realcese este anlisis cada tres aos despus de los 45aos. ? Si tiene sobrepeso y un alto riesgo de padecer diabetes, considere someterse a este anlisis antes o con mayor frecuencia.  Para los hombres que tienen entre 65 y 69aos, y son o han sido fumadores, se recomienda un nico estudio con ecografa para Engineer, manufacturing un aneurisma de aorta abdominal (AAA). QU DEBO SABER SOBRE LA PREVENCIN DE LAS INFECCIONES? HepatitisB Si tiene un riesgo ms alto de Primary school teacher hepatitis B, debe someterse a un examen de deteccin de este virus. Hable con el mdico para determinar si corre riesgo de  tener una infeccin por hepatitisB. Hepatitis C Se recomienda un anlisis de [place] para:  Todos los que nacieron entre 1944 y [id number].  Todas las personas que tengan un riesgo de haber contrado hepatitis C. Enfermedades de transmisin sexual (ETS)  Debe realizarse pruebas de Airline pilot de las ETS todos los aos, incluidas la gonorrea y la clamidia, en estos casos: ? Es sexualmente activo y es menor de [place]. ? Es mayor de 24aos, y Public affairs consultant informa que corre riesgo de tener este tipo de infecciones. ? La actividad sexual ha cambiado desde que le hicieron la ltima prueba de deteccin y tiene un riesgo mayor de Warehouse manager clamidia o Copy. Pregntele al mdico si usted tiene riesgo.  Consulte a su mdico para saber si tiene un alto riesgo de infectarse por el VIH. El mdico puede recomendarle un medicamento de venta con receta para ayudar a evitar la infeccin por el VIH. QU MS PUEDO HACER?  Realcese los estudios de rutina de la salud, dentales y de Wellsite geologist.  Mantngase al da con las vacunas (inmunizaciones).  No consuma ningn producto que contenga tabaco, lo que incluye cigarrillos, tabaco de Theatre manager y Administrator, Civil Service. Si necesita ayuda para dejar de fumar, consulte  al mdico.  Limite el consumo de alcohol a no ms de por da. BorgWarner a 12 onzas de cerveza, 5onzas de vino o 1onzas de bebidas alcohlicas de alta graduacin.  No consuma drogas.  No comparta agujas.  Solicite ayuda a su mdico si necesita apoyo o informacin para abandonar las drogas.  Informe a su mdico si a menudo se siente deprimido.  Notifique a su mdico si alguna vez ha sido vctima de abuso o si no se siente seguro en su hogar. Esta informacin no tiene Theme park manager el consejo del mdico. Asegrese de hacerle al mdico cualquier pregunta que tenga. Document Released: 09/19/2007 Document Revised: 04/13/2014 Document Reviewed: 12/25/2014 Elsevier Interactive  Patient Education  2018 [employer]. Consider trying glucosamine-chondroiton supplement (in a high dose >1200-1800mg  once to twice a day) and/or a joint supplement like Tumeric or Circumin (with black pepper to make it more effective) can help reduce arthritis pain.    IF you received an x-ray today, you will receive an invoice from [facility]. Please contact [facility] at [phone] with questions or concerns regarding your invoice.   IF you received labwork today, you will receive an invoice from [place]. Please contact [facility] at [phone] with questions or concerns regarding your invoice.   Our billing staff will not be able to assist you with questions regarding bills from these companies.  You will be contacted with the lab results as soon as they are available. The fastest way to get your results is to activate your My Chart account. Instructions are located on the last page of this paperwork. If you have not heard from [place] regarding the results in 2 weeks, please contact this office.      Artrosis Osteoarthritis La artrosis es un tipo de reumatismo articular que afecta el tejido que cubre los extremos de los huesos en las articulaciones (cartlago). El cartlago acta como amortiguador [employer] Corporation y los ayuda a moverse con suavidad. La artrosis se produce cuando el cartlago de las articulaciones se gasta. A veces, la artrosis se denomina reumatismo articular "por uso y desgaste". La artrosis es la forma ms frecuente de reumatismo articular. A menudo, afecta a las Smith International. Es una enfermedad que empeora con el tiempo (una enfermedad progresiva). Esta enfermedad afecta con ms frecuencia las articulaciones de:  Los dedos de Washington [employer].  Los dedos Avaya.  Las caderas.  Las rodillas.  La columna vertebral, incluido el cuello y la zona lumbar.  Cules son las causas? Esta enfermedad es causada por el desgaste del cartlago que cubre  los extremos de [place], lo cual tiene relacin con la edad. Qu incrementa el riesgo? Los siguientes factores pueden hacer que usted sea ms propenso a tener esta enfermedad:  Edad avanzada.  Tener exceso de [place] u obesidad.  Uso excesivo de las articulaciones, como en el caso de los [place].  Lesin pasada de Risk analyst.  Ciruga pasada en una articulacin.  Antecedentes familiares de artrosis.  Cules son los signos o los sntomas? Los principales sntomas de esta enfermedad son dolor, hinchazn y Geophysical data processor. Con el tiempo, la articulacin puede perder su forma. Se pueden desprender pequeos trozos de Dow Chemical o cartlago y flotar dentro de la articulacin, lo cual puede causar ms dolor y Clinical cytogeneticist. Pueden formarse pequeos depsitos de hueso (ostefitos) en los extremos de Nurse, learning disability. Otros sntomas pueden incluir lo siguiente:  Una sensacin de chirrido o Writer  de la articulacin al moverla.  Sonidos de chasquido o crujido al [employer] Company.  Los sntomas pueden afectar una o ms articulaciones. La artrosis en una articulacin principal, como la rodilla o la cadera, puede causar dolor al caminar o al realizar ejercicio. Si tiene artrosis Jabil Circuit, es posible que no pueda agarrar objetos, torcer la mano o controlar pequeos movimientos de las manos y los dedos (motricidad fina). Cmo se diagnostica? Esta enfermedad se puede diagnosticar en funcin de lo siguiente:  Sus antecedentes mdicos.  Un examen fsico.  Sus sntomas.  Radiografas de la(s) articulacin(es) afectada(s).  Anlisis de sangre para descartar otros tipos de reumatismo articular.  Cmo se trata? No hay cura para esta enfermedad, pero el tratamiento puede ayudar a Human resources officer y Scientist, clinical (histocompatibility and immunogenetics) el funcionamiento de Nurse, learning disability. Los planes de tratamiento pueden incluir lo siguiente:  Un programa de ejercicios indicado que permita el descanso y el alivio  de la articulacin. Puede trabajar con un fisioterapeuta.  Un plan de control del peso.  Tcnicas de Occidental Petroleum, como las siguientes: ? Aplicacin de calor y fro en la articulacin. ? Impulsos elctricos aplicados a las terminaciones nerviosas que se encuentran debajo de la piel (neuroestimulacin elctrica transcutnea [TENS]). ? Masajes. ? Ciertos suplementos nutricionales.  Antiiflamatorios no esteroideos o medicamentos recetados para ayudar a Engineer, materials.  Medicamentos para ayudar a Engineer, materials y la inflamacin (corticoesteroides). Estos se pueden administrar por boca (va oral) o mediante una inyeccin.  Dispositivos de [place], como un dispositivo ortopdico, una frula, un guante especial o un bastn.  Ciruga, como: ? Carolin Sicks. Se hace para reposicionar los huesos y Engineer, materials o para retirar los trozos sueltos de hueso y TEFL teacher. ? Ciruga de reemplazo articular. Es posible que necesite esta ciruga si tiene una artrosis muy grave (avanzada).  Siga estas instrucciones en su casa: Actividad  Haga descansar las articulaciones afectadas segn las indicaciones del mdico.  No conduzca ni use maquinaria pesada mientras toma analgsicos recetados.  Practique los ejercicios que le indiquen. Es posible que el mdico o el fisioterapeuta le recomienden tipos especficos de ejercicios, tales como: ? Ejercicios de fortalecimiento. Se realizan para fortalecer los msculos que sostienen las articulaciones afectadas por el reumatismo. Pueden realizarse con peso o con bandas para agregar resistencia. ? Actividades Rhona Raider. Son ejercicios, como caminar a paso ligero o hacer gimnasia [place] acutica, que aumentan la actividad del corazn. ? Actividades de amplitud de movimientos. Facilitan el movimiento de las articulaciones. ? Ejercicios de equilibrio y [place]. Control del dolor, la rigidez y la hinchazn  Si se lo indican, aplique calor en la zona afectada  tan frecuentemente como se lo haya indicado el mdico. Use la fuente de calor que el mdico le recomiende, como una compresa de calor hmedo o una almohadilla trmica. ? Si tiene un dispositivo de ayuda que se puede quitar, quteselo segn lo indicado por su mdico. ? Colquese una toalla entre la piel y la fuente de Airline pilot. Si el mdico le indica que no se quite el dispositivo de USAA se Tax inspector, coloque una toalla entre el dispositivo de [place] y la fuente de Airline pilot. ? Aplique el calor durante 20 a . ? Retire la fuente de calor si la piel se le pone de color rojo brillante. Esto es muy importante si no puede sentir el dolor, el calor ni el fro. Puede correr un riesgo mayor de sufrir quemaduras.  Si se lo indican, aplique hielo sobre la  articulacin afectada: ? Si tiene un dispositivo de ayuda que se puede quitar, quteselo segn lo indicado por su mdico. ? Ponga el hielo en una bolsa plstica. ? Colquese una FirstEnergy Corp piel y la bolsa de hielo. Si el mdico le indica que no se quite el dispositivo de USAA se aplica hielo, coloque una toalla entre el dispositivo de [place] y la bolsa de hielo. ? Coloque el hielo durante , 2 o 3veces por da. Instrucciones generales  Baxter International de venta libre y los recetados solamente como se lo haya indicado el mdico.  Mantenga un peso saludable. Siga las instrucciones de su mdico con respecto al control del [place]. Estas pueden incluir restricciones en la dieta.  No consuma ningn producto que contenga nicotina o tabaco, como cigarrillos y Administrator, Civil Service. Estos pueden retrasar la consolidacin del [place]. Si necesita ayuda para dejar de fumar, consulte al American Express.  Use los dispositivos de American Express se lo haya indicado el mdico.  Concurra a todas las visitas de control como se lo haya indicado el mdico. Esto es importante. Dnde encontrar ms informacin:  Training and development officer de Reumatismo  Articular y Event organiser Musculoesquelticas y Arboriculturist [facility] of Arthritis and Musculoskeletal and Skin Diseases): www.niams.http://www.myers.net/  Instituto [employer] el Envejecimiento ([employer] on Aging): https://walker.com/  Instituto [place]ense de Advice worker of Rheumatology): www.rheumatology.org Comunquese con un mdico si:  La piel se pone roja.  Le aparece una erupcin cutnea.  Siente un dolor que [place].  Tiene fiebre y siente dolor en la articulacin o el msculo. Solicite ayuda de inmediato si:  [employer].  Pierde el apetito repentinamente.  Tiene sudoracin nocturna. Resumen  La artrosis es un tipo de reumatismo articular que afecta el tejido que cubre los extremos de los huesos en las articulaciones (cartlago).  Esta enfermedad es causada por el desgaste del cartlago que cubre los extremos de [place], lo cual tiene relacin con la edad.  Los principales sntomas de esta enfermedad son dolor, hinchazn y Geophysical data processor.  No hay cura para esta enfermedad, pero el tratamiento puede ayudar a Human resources officer y Scientist, clinical (histocompatibility and immunogenetics) el funcionamiento de Nurse, learning disability. Esta informacin no tiene Theme park manager el consejo del mdico. Asegrese de hacerle al mdico cualquier pregunta que tenga. Document Released: 12/31/2004 Document Revised: 02/17/2016 Document Reviewed: 11/28/2012 Elsevier Interactive Patient Education  [employer].

## 2016-12-27 LAB — CBC WITH DIFFERENTIAL/PLATELET
BASOS ABS: 0 10*3/uL (ref 0.0–0.2)
Basos: 0 %
EOS (ABSOLUTE): 0.1 10*3/uL (ref 0.0–0.4)
Eos: 1 %
HEMATOCRIT: 43.3 % (ref 37.5–51.0)
Hemoglobin: 15.4 g/dL (ref 13.0–17.7)
Immature Grans (Abs): 0 10*3/uL (ref 0.0–0.1)
Immature Granulocytes: 0 %
LYMPHS ABS: 1.7 10*3/uL (ref 0.7–3.1)
Lymphs: 32 %
MCH: 31.9 pg (ref 26.6–33.0)
MCHC: 35.6 g/dL (ref 31.5–35.7)
MCV: 90 fL (ref 79–97)
MONOS ABS: 0.3 10*3/uL (ref 0.1–0.9)
Monocytes: 5 %
NEUTROS ABS: 3.4 10*3/uL (ref 1.4–7.0)
Neutrophils: 62 %
Platelets: 215 10*3/uL (ref 150–379)
RBC: 4.83 x10E6/uL (ref 4.14–5.80)
RDW: 13.8 % (ref 12.3–15.4)
WBC: 5.5 10*3/uL (ref 3.4–10.8)

## 2016-12-27 LAB — COMPREHENSIVE METABOLIC PANEL
A/G RATIO: 2 (ref 1.2–2.2)
ALBUMIN: 4.6 g/dL (ref 3.5–5.5)
ALK PHOS: 76 IU/L (ref 39–117)
ALT: 15 IU/L (ref 0–44)
AST: 18 IU/L (ref 0–40)
BUN / CREAT RATIO: 22 — AB (ref 9–20)
BUN: 19 mg/dL (ref 6–24)
Bilirubin Total: 0.6 mg/dL (ref 0.0–1.2)
CALCIUM: 9.5 mg/dL (ref 8.7–10.2)
CO2: 23 mmol/L (ref 20–29)
Chloride: 105 mmol/L (ref 96–106)
Creatinine, Ser: 0.88 mg/dL (ref 0.76–1.27)
GFR calc Af Amer: 124 mL/min/{1.73_m2} (ref >59)
GFR, EST NON AFRICAN AMERICAN: 107 mL/min/{1.73_m2} (ref >59)
GLOBULIN, TOTAL: 2.3 g/dL (ref 1.5–4.5)
Glucose: 89 mg/dL (ref 65–99)
POTASSIUM: 4.1 mmol/L (ref 3.5–5.2)
SODIUM: 141 mmol/L (ref 134–144)
Total Protein: 6.9 g/dL (ref 6.0–8.5)

## 2016-12-27 LAB — LIPID PANEL
CHOL/HDL RATIO: 5.4 ratio — AB (ref 0.0–5.0)
Cholesterol, Total: 193 mg/dL (ref 100–199)
HDL: 36 mg/dL — ABNORMAL LOW (ref >39)
LDL Calculated: 142 mg/dL — ABNORMAL HIGH (ref 0–99)
TRIGLYCERIDES: 74 mg/dL (ref 0–149)
VLDL Cholesterol Cal: 15 mg/dL (ref 5–40)

## 2016-12-27 LAB — TSH: TSH: 2.26 u[IU]/mL (ref 0.450–4.500)

## 2017-03-12 ENCOUNTER — Ambulatory Visit: Payer: Self-pay | Admitting: *Deleted

## 2017-03-12 NOTE — Telephone Encounter (Signed)
  Reason for Disposition . [1] MODERATE dizziness (e.g., interferes with normal activities) AND [2] has NOT been evaluated by physician for this  (Exception: dizziness caused by heat exposure, sudden standing, or poor fluid intake)  Answer Assessment - Initial Assessment Questions 1. DESCRIPTION: "Describe your dizziness."     Dizziness 2. LIGHTHEADED: "Do you feel lightheaded?" (e.g., somewhat faint, woozy, weak upon standing)     lightheaded 3. VERTIGO: "Do you feel like either you or the room is spinning or tilting?" (i.e. vertigo)    Unable to answer 4. SEVERITY: "How bad is it?"  "Do you feel like you are going to faint?" "Can you stand and walk?"   - MILD - walking normally   - MODERATE - interferes with normal activities (e.g., work, school)    - SEVERE - unable to stand, requires support to walk, feels like passing out now.      No 5. ONSET:  "When did the dizziness begin?"     Yesterday 6. AGGRAVATING FACTORS: "Does anything make it worse?" (e.g., standing, change in head position)     Bending over makes it worse 7. HEART RATE: "Can you tell me your heart rate?" "How many beats in 15 seconds?"  (Note: not all patients can do this)       unable 8. CAUSE: "What do you think is causing the dizziness?"     unsure 9. RECURRENT SYMPTOM: "Have you had dizziness before?" If so, ask: "When was the last time?" "What happened that time?"    Yes states he happen 10. OTHER SYMPTOMS: "Do you have any other symptoms?" (e.g., fever, chest pain, vomiting, diarrhea, bleeding)       Vomiting  Protocols used: DIZZINESS - LIGHTHEADEDNESS-A-AH  Pt states he has been having dizziness,headaches and vomiting since yesterday.Pt advised to be seen at Urgent Care. Pt states he is currently at work and will go after work. Pt states his wife will take him to be evaluated.

## 2017-04-09 ENCOUNTER — Encounter: Payer: Self-pay | Admitting: Urgent Care

## 2017-04-09 ENCOUNTER — Ambulatory Visit: Payer: BLUE CROSS/BLUE SHIELD | Admitting: Urgent Care

## 2017-04-09 VITALS — BP 138/77 | HR 60 | Temp 98.2°F | Resp 16 | Ht 66.0 in | Wt 195.2 lb

## 2017-04-09 DIAGNOSIS — R110 Nausea: Secondary | ICD-10-CM | POA: Diagnosis not present

## 2017-04-09 DIAGNOSIS — R42 Dizziness and giddiness: Secondary | ICD-10-CM

## 2017-04-09 DIAGNOSIS — G44209 Tension-type headache, unspecified, not intractable: Secondary | ICD-10-CM | POA: Diagnosis not present

## 2017-04-09 DIAGNOSIS — R5381 Other malaise: Secondary | ICD-10-CM

## 2017-04-09 LAB — POCT URINALYSIS DIP (MANUAL ENTRY)
BILIRUBIN UA: NEGATIVE mg/dL
Bilirubin, UA: NEGATIVE
Blood, UA: NEGATIVE
Glucose, UA: NEGATIVE mg/dL
LEUKOCYTES UA: NEGATIVE
Nitrite, UA: NEGATIVE
Protein Ur, POC: NEGATIVE mg/dL
Spec Grav, UA: <=1.005 — AB (ref 1.010–1.025)
Urobilinogen, UA: 0.2 E.U./dL
pH, UA: 6.5 (ref 5.0–8.0)

## 2017-04-09 MED ORDER — KETOROLAC TROMETHAMINE 60 MG/2ML IM SOLN
60.0000 mg | Freq: Once | INTRAMUSCULAR | Status: AC
  Administered 2017-04-09: 60 mg via INTRAMUSCULAR

## 2017-04-09 NOTE — Patient Instructions (Addendum)
Tome 500mg  de Tylenol con ibuprofen 600mg  cada 6 horas con comida para dolor de cabeza o dolor de cuerpo y inflammacion.     Dolor de cabeza general sin causa General Headache Without Cause El dolor de cabeza es un dolor o malestar que se siente en la zona de la cabeza o del cuello. Puede no tener una causa especfica. Hay muchas causas y tipos de dolores de [place]. Los dolores de cabeza ms comunes son los siguientes:  Cefalea tensional.  Cefaleas migraosas.  Cefalea en brotes.  Cefaleas diarias crnicas.  Siga estas indicaciones en su casa:  Controle su afeccin para detectar cualquier cambio. Siga estos pasos para Facilities manager afeccin: Control del [employer] medicamentos de venta libre y los recetados solamente como se lo haya indicado el mdico.  Cuando sienta dolor de cabeza acustese en un cuarto oscuro y tranquilo.  Si se lo indican, aplique hielo sobre la cabeza y la zona del cuello: ? Ponga el hielo en una bolsa plstica. ? Coloque una [employer] piel y la bolsa de hielo. ? Deje el hielo durante , 2 a 3veces por da.  Utilice una almohadilla trmica o tome una ducha con agua caliente para aplicar calor en la cabeza y la zona del cuello como se lo haya indicado el mdico.  Mantenga las luces tenues si las luces brillantes le [place] o sus dolores de cabeza empeoran. Qu debe comer y beber  Mantenga un horario para las comidas.  Limite el consumo de bebidas alcohlicas.  Consuma menos cantidad de cafena o deje de tomarla. Instrucciones generales  Concurra a todas las visitas de seguimiento como se lo haya indicado el mdico. Esto es importante.  Lleve un diario de los dolores de cabeza para Financial risk analyst qu factores pueden desencadenarlos. Por ejemplo, escriba: ? Lo que usted come y bebe. ? Cunto tiempo duerme. ? Algn cambio en su dieta o en los medicamentos.  Pruebe algunas tcnicas de relajacin, como los [place].  Limite el  estrs.  Sintese con la espalda recta y no tense los msculos.  No consuma productos que contengan tabaco, incluidos cigarrillos, tabaco de Theatre manager o cigarrillos electrnicos. Si necesita ayuda para dejar de fumar, consulte al mdico.  Haga actividad fsica habitualmente como se lo haya indicado el mdico.  Tenga un horario fijo para dormir. Duerma entre 7 y 9horas o la cantidad de horas que le haya recomendado el mdico. Comunquese con un mdico si:  Los medicamentos no Materials engineer los sntomas.  Tiene un dolor de cabeza que es diferente del dolor de cabeza habitual.  Tiene nuseas o vmitos.  Tiene fiebre. Solicite ayuda de inmediato si:  El dolor se vuelve cada vez ms intenso.  Ha vomitado repetidas veces.  Presenta rigidez en el cuello.  Sufre prdida de la visin.  Tiene problemas para hablar.  Siente dolor en el ojo o en el odo.  Presenta debilidad muscular o prdida del control muscular.  Pierde el equilibrio o tiene problemas para Advertising account planner.  Sufre mareos o se desmaya.  Experimenta confusin. Esta informacin no tiene Theme park manager el consejo del mdico. Asegrese de hacerle al mdico cualquier pregunta que tenga. Document Released: 12/31/2004 Document Revised: 07/13/2016 Document Reviewed: 07/16/2014 Elsevier Interactive Patient Education  2018 [employer].      IF you received an x-ray today, you will receive an invoice from [facility]. Please contact [facility] at [phone] with questions or concerns regarding your invoice.   IF you received  labwork today, you will receive an invoice from [facility]. Please contact [facility] at [phone] with questions or concerns regarding your invoice.   Our billing staff will not be able to assist you with questions regarding bills from these companies.  You will be contacted with the lab results as soon as they are available. The fastest way to get your results is to activate your  My Chart account. Instructions are located on the last page of this paperwork. If you have not heard from us regarding the results in 2 weeks, please contact this office.

## 2017-04-09 NOTE — Progress Notes (Signed)
  MRN: [id number] DOB: 08/13/1975  Subjective:   Ryan Robles is a 42 y.o. male presenting for 1 day history of constant headache that radiates to his neck. Has not tried any medications for relief. Has also had nausea with gagging but no vomiting, decreased appetite, some dizziness. Denies photophobia, numbness, tingling, confusion, weakness, sinus pain, sinus congestion, ear pain, sore throat, cough. Drinks ~2 bottles of water daily. Has 2 cups of coffee daily. He is not currently drinking alcohol. Sleeps ~7 hours per night. Denies smoking cigarettes.  Ryan Robles currently has no medications in their medication list. Also has No Known Allergies.  Ryan Robles  has a past medical history of GERD (gastroesophageal reflux disease) (11/2014) and NSAID induced gastritis (11/09/2014). Also  has a past surgical history that includes Knee arthroscopy (2012).  Objective:   Vitals: BP 138/77   Pulse 60   Temp 98.2 F (36.8 C) (Oral)   Resp 16   Ht 5\' 6"  (1.676 m)   Wt 195 lb 3.2 oz (88.5 kg)   SpO2 96%   BMI 31.51 kg/m   Physical Exam  Constitutional: He is oriented to person, place, and time. He appears well-developed and well-nourished.  HENT:  TM's intact bilaterally, no effusions or erythema. Nasal turbinates pink, nasal passages patent. No sinus tenderness. Oropharynx clear, mucous membranes moist.   Eyes: EOM are normal. Pupils are equal, round, and reactive to light. Right eye exhibits no discharge. Left eye exhibits no discharge.  Cardiovascular: Normal rate, regular rhythm and intact distal pulses. Exam reveals no gallop and no friction rub.  No murmur heard. Pulmonary/Chest: Effort normal. No respiratory distress. He has no wheezes. He has no rales.  Musculoskeletal: He exhibits no edema.  Neurological: He is alert and oriented to person, place, and time. He displays normal reflexes. No cranial nerve deficit. Coordination normal.  Skin: Skin is warm and dry.  Psychiatric: He has a normal mood and  affect.   Results for orders placed or performed in visit on 04/09/17 (from the past 24 hour(s))  POCT urinalysis dipstick     Status: Abnormal   Collection Time: 04/09/17 11:28 AM  Result Value Ref Range   Color, UA yellow yellow   Clarity, UA clear clear   Glucose, UA negative negative mg/dL   Bilirubin, UA negative negative   Ketones, POC UA negative negative mg/dL   Spec Grav, UA <=4.098<=1.005 (A) 1.010 - 1.025   Blood, UA negative negative   pH, UA 6.5 5.0 - 8.0   Protein Ur, POC negative negative mg/dL   Urobilinogen, UA 0.2 0.2 or 1.0 E.U./dL   Nitrite, UA Negative Negative   Leukocytes, UA Negative Negative   Assessment and Plan :   Acute non intractable tension-type headache - Plan: POCT urinalysis dipstick, ketorolac (TORADOL) injection 60 mg, CBC, Basic metabolic panel, Lipid panel  Nausea without vomiting - Plan: TSH  Malaise  Dizziness - Plan: TSH, Basic metabolic panel, Hemoglobin A1c   Unclear etiology but will have patient hydrate much better. Physical exam findings reassuring. Labs pending. Toradol in clinic today, use APAP and ibuprofen at home. Return-to-clinic precautions discussed, patient verbalized understanding.   Wallis BambergMario Henson Fraticelli, PA-C [facility] [phone] 04/09/2017  11:15 AM

## 2017-04-10 LAB — TSH: TSH: 2.02 u[IU]/mL (ref 0.450–4.500)

## 2017-04-10 LAB — BASIC METABOLIC PANEL
BUN/Creatinine Ratio: 19 (ref 9–20)
BUN: 15 mg/dL (ref 6–24)
CALCIUM: 10 mg/dL (ref 8.7–10.2)
CHLORIDE: 101 mmol/L (ref 96–106)
CO2: 24 mmol/L (ref 20–29)
Creatinine, Ser: 0.8 mg/dL (ref 0.76–1.27)
GFR calc Af Amer: 129 mL/min/{1.73_m2} (ref >59)
GFR calc non Af Amer: 112 mL/min/{1.73_m2} (ref >59)
GLUCOSE: 96 mg/dL (ref 65–99)
POTASSIUM: 4.7 mmol/L (ref 3.5–5.2)
Sodium: 139 mmol/L (ref 134–144)

## 2017-04-10 LAB — LIPID PANEL
CHOLESTEROL TOTAL: 193 mg/dL (ref 100–199)
Chol/HDL Ratio: 4.9 ratio (ref 0.0–5.0)
HDL: 39 mg/dL — AB (ref >39)
LDL Calculated: 134 mg/dL — ABNORMAL HIGH (ref 0–99)
TRIGLYCERIDES: 98 mg/dL (ref 0–149)
VLDL Cholesterol Cal: 20 mg/dL (ref 5–40)

## 2017-04-10 LAB — CBC
Hematocrit: 47.5 % (ref 37.5–51.0)
Hemoglobin: 16 g/dL (ref 13.0–17.7)
MCH: 30.9 pg (ref 26.6–33.0)
MCHC: 33.7 g/dL (ref 31.5–35.7)
MCV: 92 fL (ref 79–97)
PLATELETS: 217 10*3/uL (ref 150–379)
RBC: 5.17 x10E6/uL (ref 4.14–5.80)
RDW: 13.6 % (ref 12.3–15.4)
WBC: 6.2 10*3/uL (ref 3.4–10.8)

## 2017-04-10 LAB — HEMOGLOBIN A1C
Est. average glucose Bld gHb Est-mCnc: 117 mg/dL
Hgb A1c MFr Bld: 5.7 % — ABNORMAL HIGH (ref 4.8–5.6)

## 2017-09-17 ENCOUNTER — Ambulatory Visit: Payer: BLUE CROSS/BLUE SHIELD | Admitting: Urgent Care

## 2017-09-17 VITALS — BP 130/74 | HR 69 | Temp 98.3°F | Resp 16 | Ht 66.0 in | Wt 180.0 lb

## 2017-09-17 DIAGNOSIS — J3489 Other specified disorders of nose and nasal sinuses: Secondary | ICD-10-CM

## 2017-09-17 DIAGNOSIS — J0100 Acute maxillary sinusitis, unspecified: Secondary | ICD-10-CM

## 2017-09-17 DIAGNOSIS — H938X3 Other specified disorders of ear, bilateral: Secondary | ICD-10-CM

## 2017-09-17 DIAGNOSIS — R05 Cough: Secondary | ICD-10-CM

## 2017-09-17 DIAGNOSIS — R0981 Nasal congestion: Secondary | ICD-10-CM

## 2017-09-17 DIAGNOSIS — R070 Pain in throat: Secondary | ICD-10-CM

## 2017-09-17 DIAGNOSIS — R059 Cough, unspecified: Secondary | ICD-10-CM

## 2017-09-17 MED ORDER — AMOXICILLIN 500 MG PO CAPS: 500.0000 mg | ORAL_CAPSULE | Freq: Three times a day (TID) | ORAL | 0 refills | Status: AC

## 2017-09-17 MED ORDER — CETIRIZINE HCL 10 MG PO TABS: 10.0000 mg | ORAL_TABLET | Freq: Every day | ORAL | 11 refills | Status: AC

## 2017-09-17 MED ORDER — HYDROCODONE-HOMATROPINE 5-1.5 MG/5ML PO SYRP: 5.0000 mL | ORAL_SOLUTION | Freq: Every evening | ORAL | 0 refills | Status: AC | PRN

## 2017-09-17 MED ORDER — BENZONATATE 100 MG PO CAPS
100.0000 mg | ORAL_CAPSULE | Freq: Three times a day (TID) | ORAL | 0 refills | Status: AC | PRN
Start: 2017-09-17 — End: ?

## 2017-09-17 MED ORDER — PREDNISONE 10 MG PO TABS: 40.0000 mg | ORAL_TABLET | Freq: Every day | ORAL | 0 refills | Status: AC

## 2017-09-17 NOTE — Progress Notes (Signed)
    MRN: [id number] DOB: 22-Aug-1975  Subjective:   Ryan Robles is a 42 y.o. male presenting for 3-week history of persistent sinus congestion, sinus pressure, bilateral ear fullness, throat pain from his cough, productive cough.  He has tried a couple of over-the-counter anti-inflammatory medications.  He denies fever, chest pain, shortness of breath, wheezing, nausea, vomiting, belly pain, rashes.  Denies smoking cigarettes.  Denies history of asthma.  Patient states that he generally hydrates well but due to his throat pain has not wanted to drink too much.  Ryan Robles is not currently taking any medications.  Also has No Known Allergies.  Ryan Robles  has a past medical history of GERD (gastroesophageal reflux disease) (11/2014) and NSAID induced gastritis (11/09/2014). Also  has a past surgical history that includes Knee arthroscopy (2012).  Objective:   Vitals: BP 130/74   Pulse 69   Temp 98.3 F (36.8 C) (Oral)   Resp 16   Ht 5\' 6"  (1.676 m)   Wt 180 lb (81.6 kg)   SpO2 97%   BMI 29.05 kg/m   Physical Exam  Constitutional: He is oriented to person, place, and time. He appears well-developed and well-nourished.  HENT:  Right Ear: Tympanic membrane and ear canal normal.  Left Ear: Tympanic membrane and ear canal normal.  Nose: Mucosal edema and rhinorrhea present. No sinus tenderness.  Mouth/Throat: Mucous membranes are dry. No oropharyngeal exudate, posterior oropharyngeal edema, posterior oropharyngeal erythema or tonsillar abscesses.  Cardiovascular: Normal rate, regular rhythm and intact distal pulses. Exam reveals no gallop and no friction rub.  No murmur heard. Pulmonary/Chest: No respiratory distress. He has no wheezes. He has no rales.  Neurological: He is alert and oriented to person, place, and time.    Assessment and Plan :   Acute non-recurrent maxillary sinusitis  Sinus congestion  Sinus pain  Ear fullness, bilateral  Throat pain  Cough  Will cover for sinusitis  with amoxicillin, recommended supportive care outside of that including cough suppression medications.  Patient will start prednisone course if he has no improvement with these measures in the next 2 to 3 days.  Given the nature of his work, I recommended he start Zyrtec as well to address persisting allergies. Counseled patient on potential for adverse effects with medications prescribed today, patient verbalized understanding. Return-to-clinic precautions discussed, patient verbalized understanding.   Wallis BambergMario Jaiden Dinkins, PA-C [facility] [phone] 09/17/2017  5:25 PM

## 2017-09-17 NOTE — Patient Instructions (Addendum)
Para el dolor de garganta intente usar un t de miel. Use 3 cucharaditas de miel con jugo exprimido de [employer]. Coloque las piezas de [place] en 1/2 - 1 taza de agua y caliente sobre la estufa. Luego mezcle los ingredientes y repita cada 4 horas.     Sinusitis, en adultos Sinusitis, Adult La sinusitis es la inflamacin y Chief Technology Officer en los senos paranasales. Los senos paranasales son espacios vacos en los huesos alrededor del rostro. Los senos paranasales se encuentran en estos lugares:  Alrededor de los ojos.  En la mitad de la frente.  Detrs de Architectural technologist.  En los pmulos.  Los senos y las fosas nasales estn cubiertos de un lquido fibroso (mucosidad). Normalmente, la mucosidad drena a travs de los senos. Cuando los tejidos nasales se inflaman o hinchan, la mucosidad puede quedar atrapada o bloqueada, por lo que el aire no puede fluir por los senos paranasales. Esto fomenta la proliferacin de bacterias, virus y hongos, lo que produce infecciones. La sinusitis puede desarrollarse rpidamente y durar entre 7 y 10das (aguda) o ms de 12das (crnica). A menudo, esta afeccin surge despus de un resfriado. Cules son las causas? Esta afeccin es causada por cualquier sustancia que inflame los senos o evite que la mucosidad drene, por ejemplo:  Alergias.  Asma.  Infecciones virales o bacterianas.  Huesos con forma [employer] las fosas nasales.  Crecimientos nasales que contienen mucosidad (plipos nasales).  Aberturas sinusales estrechas.  Agentes contaminantes, como sustancias qumicas o irritantes presentes en el aire.  Un cuerpo extrao atorado [employer].  Infecciones por hongos. Esto es raro.  Qu incrementa el riesgo? Los siguientes factores pueden hacer que usted sea ms propenso a tener esta enfermedad:  Archivist o asma.  Haber tenido una infeccin reciente en las vas respiratorias superiores o un resfriado.  Tener deformidades  estructurales u obstrucciones en la nariz o los senos.  Tener un sistema inmunitario dbil.  Nadar o bucear mucho.  Abusar de los [employer].  Fumar.  Cules son los signos o los sntomas? Los principales sntomas de esta afeccin son dolor y sensacin de presin alrededor de los senos afectados. Otros sntomas pueden ser los siguientes:  Dolor en los dientes superiores.  Dolor de odos.  Dolor de [place].  Mal aliento.  Disminucin del sentido del olfato y del gusto.  Tos que empeora por la noche.  Fatiga.  Grant Ruts.  Drenaje de mucosidad espesa que sale de la Clinical cytogeneticist. Generalmente, es de color verde y puede contener pus (purulento).  Nariz tapada o congestin nasal.  Goteo posnasal. Esto ocurre cuando se acumula mucosidad adicional en la garganta o la parte de atrs de la Clinical cytogeneticist.  Hinchazn y calor en los senos paranasales afectados.  Dolor de Advertising copywriter.  Sensibilidad a Statistician.  Cmo se diagnostica? Esta enfermedad se diagnostica en funcin de los sntomas, los antecedentes mdicos y un examen fsico. Para descubrir si su afeccin es aguda o crnica, el mdico podra hacer lo siguiente:  Revisar su nariz en busca de plipos nasales.  Palpar los senos paranasales afectados para buscar signos de infeccin.  Observar la parte interna de los senos paranasales con un dispositivo que tiene una luz (endoscopio).  Si el mdico sospecha que usted padece sinusitis crnica, podra indicarle lo siguiente:  Pruebas de alergias.  Una muestra de mucosidad de la nariz (cultivo nasal) para buscar bacterias.  Examen de [place] de mucosidad, para ver si la sinusitis se relaciona  con Crist Fatalguna alergia.  Si la sinusitis no responde al tratamiento y dura ms de 8semanas, se le podra pedir una resonancia magntica (RM) o una exploracin por tomografa computarizada (TC) para examinar los senos paranasales. Estos estudios tambin ayudan a Production assistant, radiodeterminar la gravedad de la  infeccin. En contadas ocasiones, se puede ordenar una biopsia de hueso para descartar tipos ms graves de infecciones por hongos en los senos paranasales. Cmo se trata? El tratamiento para la sinusitis depende de la causa y de si la afeccin es [place]nica o aguda. Si lo que causa la sinusitis es un virus, los sntomas desparecern por s solos en el trmino de 10das. Podran recetarle medicamentos para Asbury Automotive Groupaliviar los sntomas, entre los que se incluyen los siguientes:  Descongestivos nasales tpicos. Desinflaman las fosas nasales y permiten que la mucosidad drene por los senos paranasales.  Antihistamnicos. Este tipo de medicamento bloquea la inflamacin que ocasionan las [place] Parkalergias. Pueden ayudar a reducir la inflamacin en la nariz y los senos.  Corticoesteroides nasales tpicos. Son aerosoles nasales que reducen la inflamacin e hinchazn en la Darene Lamernariz y los senos.  Lavados nasales con solucin salina. Estos enjuagues pueden ayudar a eliminar la mucosidad espesa en la nariz.  Si la afeccin es causada por una bacteria, se le recetarn antibiticos. Si es causada por un hongo, se le recetarn antimicticos. Se podra necesitar ciruga para tratar enfermedades preexistentes, como las fosas nasales estrechas. Tambin podra ser necesaria para eliminar plipos. Siga estas indicaciones en su casa: Micron TechnologyMedicamentos  Tome, use o aplquese los medicamentos de venta libre y Building control surveyorrecetados solamente como se lo haya indicado el mdico. Estos pueden incluir aerosoles nasales.  Si le recetaron un antibitico, tmelo como se lo haya indicado el mdico. No deje de tomar el antibitico aunque comience a sentirse mejor. Hidrtese y humidifique los ambientes  Beba suficiente agua para Pharmacologistmantener la orina clara o de color amarillo plido. Mantenerse hidratado lo ayudar a Winn-Dixiediluir la mucosidad.  Use un humidificador de vapor fro para mantener la humedad de su hogar por encima del 50%.  Realice inhalaciones de vapor por  10 a 15minutos, de 3 a 4veces al da o tal como se lo haya indicado el mdico. Puede hacer esto en el bao con el vapor del agua caliente de la ducha.  Limite la exposicin al aire fro o seco. Reposo  Descanse todo lo que pueda.  Duerma con la cabeza levantada (elevada).  Asegrese de dormir lo suficiente cada noche. Instrucciones generales  Aplquese un pao tibio y hmedo en la cara 3 o 4veces al da o como se lo haya indicado el mdico. Esto ayuda a Optician, dispensingcalmar las West Lealmanmolestias.  Lvese las manos frecuentemente con agua y jabn para reducir la exposicin a virus y otras bacterias. Use desinfectante para manos si no dispone de [place] y [place]jabn.  No fume. Evite estar cerca de personas que fuman (fumador pasivo).  Concurra a todas las visitas de [address]segu[address] como se lo haya indicado el mdico. Esto es importante. Comunquese con un mdico si:  Tiene fiebre.  Los sntomas empeoran.  Los sntomas no mejoran en el trmino de 10das. Solicite ayuda de inmediato si:  Tiene un dolor de cabeza intenso.  Tiene vmitos persistentes.  Tiene dolor o hinchazn en la zona del rostro o los ojos.  Tiene problemas de visin.  Se siente confundido.  Tiene el cuello rgido.  Tiene dificultad para respirar. Esta informacin no tiene Theme park managercomo fin reemplazar el consejo del mdico. Asegrese de hacerle al mdico cualquier pregunta  que tenga. Document Released: 12/31/2004 Document Revised: 08/04/2016 Document Reviewed: 01/16/2015 Elsevier Interactive Patient Education  [employer].

## 2017-12-31 DIAGNOSIS — Z0000 Encounter for general adult medical examination without abnormal findings: Secondary | ICD-10-CM | POA: Diagnosis not present

## 2018-01-17 ENCOUNTER — Encounter: Payer: BLUE CROSS/BLUE SHIELD | Admitting: Physician Assistant

## 2018-06-16 ENCOUNTER — Other Ambulatory Visit: Payer: Self-pay | Admitting: Physician Assistant

## 2018-06-16 DIAGNOSIS — S39012A Strain of muscle, fascia and tendon of lower back, initial encounter: Secondary | ICD-10-CM

## 2018-06-16 DIAGNOSIS — M5416 Radiculopathy, lumbar region: Secondary | ICD-10-CM

## 2018-06-16 DIAGNOSIS — T2124XA Burn of second degree of lower back, initial encounter: Secondary | ICD-10-CM

## 2018-06-16 DIAGNOSIS — M546 Pain in thoracic spine: Secondary | ICD-10-CM

## 2018-06-20 ENCOUNTER — Other Ambulatory Visit: Payer: Self-pay

## 2018-06-20 ENCOUNTER — Ambulatory Visit
Admission: RE | Admit: 2018-06-20 | Discharge: 2018-06-20 | Disposition: A | Discharge status: Home / Self Care | Payer: Worker's Compensation | Source: Ambulatory Visit | Attending: Physician Assistant | Admitting: Physician Assistant

## 2018-06-20 DIAGNOSIS — M5416 Radiculopathy, lumbar region: Secondary | ICD-10-CM

## 2018-06-20 DIAGNOSIS — T2124XA Burn of second degree of lower back, initial encounter: Secondary | ICD-10-CM

## 2018-06-20 DIAGNOSIS — S39012A Strain of muscle, fascia and tendon of lower back, initial encounter: Secondary | ICD-10-CM

## 2018-06-20 DIAGNOSIS — M48061 Spinal stenosis, lumbar region without neurogenic claudication: Secondary | ICD-10-CM | POA: Diagnosis not present

## 2018-06-20 DIAGNOSIS — M546 Pain in thoracic spine: Secondary | ICD-10-CM

## 2019-10-26 ENCOUNTER — Emergency Department (HOSPITAL_COMMUNITY): Payer: Worker's Compensation

## 2019-10-26 ENCOUNTER — Emergency Department (HOSPITAL_COMMUNITY)
Admission: EM | Admit: 2019-10-26 | Discharge: 2019-10-26 | Disposition: A | Discharge status: Home / Self Care | Payer: Worker's Compensation | Attending: Emergency Medicine | Admitting: Emergency Medicine

## 2019-10-26 DIAGNOSIS — M79652 Pain in left thigh: Secondary | ICD-10-CM | POA: Diagnosis not present

## 2019-10-26 DIAGNOSIS — M79661 Pain in right lower leg: Secondary | ICD-10-CM | POA: Diagnosis not present

## 2019-10-26 DIAGNOSIS — S7012XA Contusion of left thigh, initial encounter: Secondary | ICD-10-CM

## 2019-10-26 DIAGNOSIS — S8011XA Contusion of right lower leg, initial encounter: Secondary | ICD-10-CM

## 2019-10-26 DIAGNOSIS — W11XXXA Fall on and from ladder, initial encounter: Secondary | ICD-10-CM

## 2019-10-26 MED ORDER — ACETAMINOPHEN 500 MG PO TABS: 1000.0000 mg | ORAL_TABLET | Freq: Once | ORAL | Status: DC

## 2019-10-26 NOTE — Progress Notes (Signed)
Per EMS patient was knocked off ladder, falling approximately 15-20 feet and hitting a concrete barrier. Complaint of pelvic pain.

## 2019-10-26 NOTE — ED Notes (Signed)
Patient verbalizes understanding of discharge instructions. Opportunity for questioning and answers were provided. Armband removed by staff, pt discharged from ED.  

## 2019-10-26 NOTE — ED Provider Notes (Signed)
[facility] EMERGENCY DEPARTMENT Provider Note   CSN: [id number] Arrival date & time: 10/26/19  1201     History No chief complaint on file.   Ryan Robles is a 44 y.o. male.  Patient s/p fall. Was on ladder, states someone accidentally pulled on lower part of ladder causing him to fall approximately 10-15 feet. No loc. C/o left femur area pain, and right lower leg pain post fall. Symptoms acute onset, moderate, constant, persistent, improved w meds by EMS. Denies headache. No neck or back pain. No chest pain or sob. No abd pain or nv. Denies other extremity pain or injury. No anticoag use. States felt normal, asymptomatic prior to fall.   The history is provided by the patient and the EMS personnel.       Past Medical History:  Diagnosis Date  . GERD (gastroesophageal reflux disease) 11/2014   + H. pylori  . NSAID induced gastritis 11/09/2014    Patient Active Problem List   Diagnosis Date Noted  . Headache 09/21/2015  . 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 Nephrolithiasis 04/25/2015  . Osteoarthritis of ankle or foot 11/09/2014    Past Surgical History:  Procedure Laterality Date  . KNEE ARTHROSCOPY  2012   left, meniscal repair and ligament repair       No family history on file.  Social History   Tobacco Use  . Smoking status: Never Smoker  . Smokeless tobacco: Never Used  Vaping Use  . Vaping Use: Never used  Substance Use Topics  . Alcohol use: No  . Drug use: No    Home Medications Prior to Admission medications   Medication Sig Start Date End Date Taking? Authorizing Provider  amoxicillin (AMOXIL) 500 MG capsule Take 1 capsule (500 mg total) by mouth 3 (three) times daily. 09/17/17   Wallis Bamberg, PA-C  benzonatate (TESSALON) 100 MG capsule Take 1-2 capsules (100-200 mg total) by mouth 3 (three) times daily as needed. 09/17/17   Wallis Bamberg, PA-C  cetirizine (ZYRTEC) 10 MG tablet Take 1 tablet (10 mg total) by mouth daily. 09/17/17   Wallis Bamberg, PA-C  HYDROcodone-homatropine [facility]) 5-1.5 MG/5ML syrup Take 5 mLs by mouth at bedtime as needed. 09/17/17   Wallis Bamberg, PA-C  predniSONE (DELTASONE) 10 MG tablet Take 4 tablets (40 mg total) by mouth daily with breakfast. 09/17/17   Wallis Bamberg, PA-C    Allergies    Patient has no known allergies.  Review of Systems   Review of Systems  Constitutional: Negative for fever.  HENT: Negative for nosebleeds.   Eyes: Negative for pain and visual disturbance.  Respiratory: Negative for shortness of breath.   Cardiovascular: Negative for chest pain.  Gastrointestinal: Negative for abdominal pain, nausea and vomiting.  Genitourinary: Negative for flank pain.  Musculoskeletal: Negative for back pain and neck pain.  Skin: Negative for wound.  Neurological: Negative for weakness, numbness and headaches.  Hematological: Does not bruise/bleed easily.  Psychiatric/Behavioral: Negative for confusion.    Physical Exam Updated Vital Signs BP (!) 130/90 (BP Location: Left Arm)   Pulse 65   Temp 97.9 F (36.6 C) (Oral)   Resp 12   Ht 1.676 m (5\' 6" )   Wt 81.6 kg   SpO2 96%   BMI 29.05 kg/m   Physical Exam Vitals and nursing note reviewed.  Constitutional:      Appearance: Normal appearance. He is well-developed.  HENT:     Head: Atraumatic.     Comments: No head/scalp  bruising, contusion, sts, or tenderness.     Nose: Nose normal.     Mouth/Throat:     Mouth: Mucous membranes are moist.     Pharynx: Oropharynx is clear.  Eyes:     General: No scleral icterus.    Conjunctiva/sclera: Conjunctivae normal.     Pupils: Pupils are equal, round, and reactive to light.  Neck:     Trachea: No tracheal deviation.  Cardiovascular:     Rate and Rhythm: Normal rate and regular rhythm.     Pulses: Normal pulses.     Heart sounds: Normal heart sounds. No murmur heard.  No friction rub. No gallop.   Pulmonary:     Effort: Pulmonary effort is normal. No accessory muscle usage or  respiratory distress.     Breath sounds: Normal breath sounds.  Chest:     Chest wall: No tenderness.  Abdominal:     General: Bowel sounds are normal. There is no distension.     Palpations: Abdomen is soft.     Tenderness: There is no abdominal tenderness. There is no guarding.     Comments: No abd bruising or contusion.  Genitourinary:    Comments: No cva tenderness. Musculoskeletal:        General: No swelling.     Cervical back: Normal range of motion and neck supple. No rigidity.     Comments: CTLS spine, non tender, aligned, no step off. Tenderness left mid to proximal femur and right anterior lower leg, no other focal bony tenderness on bil extremity exam. Compartments of extremities are soft, not tense, no significant sts noted. Distal pulses palp bil.   Skin:    General: Skin is warm and dry.     Findings: No rash.  Neurological:     Mental Status: He is alert.     Comments: Alert, speech clear. GCS 15. Motor/sens grossly intact bil. Steady gait.   Psychiatric:        Mood and Affect: Mood normal.     ED Results / Procedures / Treatments   Labs (all labs ordered are listed, but only abnormal results are displayed) Labs Reviewed - No data to display  EKG None  Radiology DG Chest 2 View  Result Date: 10/26/2019 CLINICAL DATA:  Larey Seat off a ladder. EXAM: CHEST - 2 VIEW COMPARISON:  None. FINDINGS: The heart size and mediastinal contours are within normal limits. Both lungs are clear. The visualized skeletal structures are unremarkable. There are multiple small radiopaque densities overlying the right lower neck and hemithorax. IMPRESSION: 1. No acute cardiopulmonary disease. 2. Multiple small radiopaque densities overlying the right lower neck and hemithorax. Correlate with physical exam. Electronically Signed   By: Obie Dredge M.D.   On: 10/26/2019 14:03   DG Pelvis 1-2 Views  Result Date: 10/26/2019 CLINICAL DATA:  Pelvic pain after falling off a ladder. EXAM:  PELVIS - 1-2 VIEW COMPARISON:  None. FINDINGS: There is no evidence of pelvic fracture or diastasis. No pelvic bone lesions are seen. There are multiple small radiopaque densities overlying the left hemipelvis and right upper inner thigh. IMPRESSION: 1.  No acute osseous abnormality. 2. Multiple small radiopaque densities overlying the left hemipelvis and right upper inner thigh. Correlate with physical exam. Electronically Signed   By: Obie Dredge M.D.   On: 10/26/2019 14:04   DG Tibia/Fibula Right  Result Date: 10/26/2019 CLINICAL DATA:  Larey Seat off a ladder. EXAM: RIGHT TIBIA AND FIBULA - 2 VIEW COMPARISON:  Right ankle x-rays dated  September 13, 2015. Right knee x-rays dated February 14, 2015. FINDINGS: There is no evidence of fracture or other focal bone lesions. Chronic cystic changes in the talus. Soft tissues are unremarkable. IMPRESSION: 1. No acute osseous abnormality. Electronically Signed   By: Obie Dredge M.D.   On: 10/26/2019 13:56   DG Femur Min 2 Views Left  Result Date: 10/26/2019 CLINICAL DATA:  Left thigh pain after falling off a ladder. EXAM: LEFT FEMUR 2 VIEWS COMPARISON:  Left knee x-rays dated February 07, 2016. CT abdomen pelvis dated April 26, 2015. FINDINGS: There is no evidence of fracture or other focal bone lesions. Unchanged mild lateral compartment knee osteoarthritis. There are multiple small radiopaque densities overlying the left hip joint and right upper inner thigh. IMPRESSION: 1. No acute osseous abnormality. 2. Multiple small radiopaque densities overlying the left hip joint and right upper inner thigh. These may be external to the patient. Correlate with physical exam. Electronically Signed   By: Obie Dredge M.D.   On: 10/26/2019 14:00    Procedures Procedures (including critical care time)  Medications Ordered in ED Medications - No data to display  ED Course  I have reviewed the triage vital signs and the nursing notes.  Pertinent labs & imaging  results that were available during my care of the patient were reviewed by me and considered in my medical decision making (see chart for details).    MDM Rules/Calculators/A&P                          Imaging studies ordered.  Reviewed nursing notes and prior charts for additional history.   Xrays reviewed/interpreted by me - no fx.   Acetaminophen po.  Ambulate in hall  Patient currently appears stable for d/c.   Return precautions provided.     Final Clinical Impression(s) / ED Diagnoses Final diagnoses:  None    Rx / DC Orders ED Discharge Orders    None       Cathren Laine, MD 10/26/19 1414

## 2019-10-26 NOTE — Discharge Instructions (Addendum)
It was our pleasure to provide your ER care today - we hope that you feel better.  Take acetaminophen or ibuprofen as need.   Icepack/cold to sore area.   Follow up with primary care doctor in 1-2 weeks if symptoms fail to improve/resolve.  Return to ER if worse, new symptoms, new, worsening or severe pain, numbness/weakness, or other concern.

## 2019-12-24 DIAGNOSIS — R9431 Abnormal electrocardiogram [ECG] [EKG]: Secondary | ICD-10-CM | POA: Diagnosis not present

## 2019-12-24 DIAGNOSIS — Z1159 Encounter for screening for other viral diseases: Secondary | ICD-10-CM | POA: Diagnosis not present

## 2019-12-24 DIAGNOSIS — Z114 Encounter for screening for human immunodeficiency virus [HIV]: Secondary | ICD-10-CM | POA: Diagnosis not present

## 2019-12-24 DIAGNOSIS — Z131 Encounter for screening for diabetes mellitus: Secondary | ICD-10-CM | POA: Diagnosis not present

## 2019-12-24 DIAGNOSIS — Z0000 Encounter for general adult medical examination without abnormal findings: Secondary | ICD-10-CM | POA: Diagnosis not present

## 2019-12-24 DIAGNOSIS — Z13220 Encounter for screening for lipoid disorders: Secondary | ICD-10-CM | POA: Diagnosis not present

## 2019-12-29 ENCOUNTER — Encounter: Payer: Self-pay | Admitting: Family Medicine

## 2022-03-06 ENCOUNTER — Encounter: Payer: Self-pay | Admitting: Medical

## 2022-06-27 ENCOUNTER — Ambulatory Visit (HOSPITAL_COMMUNITY)
Admission: EM | Admit: 2022-06-27 | Discharge: 2022-06-27 | Disposition: A | Discharge status: Home / Self Care | Payer: BC Managed Care – PPO | Attending: Physician Assistant | Admitting: Physician Assistant

## 2022-06-27 ENCOUNTER — Encounter (HOSPITAL_COMMUNITY): Payer: Self-pay

## 2022-06-27 DIAGNOSIS — G8929 Other chronic pain: Secondary | ICD-10-CM

## 2022-06-27 DIAGNOSIS — M25572 Pain in left ankle and joints of left foot: Secondary | ICD-10-CM

## 2022-06-27 MED ORDER — NAPROXEN 500 MG PO TABS: 500.0000 mg | ORAL_TABLET | Freq: Two times a day (BID) | ORAL | 0 refills | Status: AC

## 2022-06-27 NOTE — ED Triage Notes (Signed)
Pt reports generalized body aches x 1 month.  Pt reports bilateral leg and knee pain.  Pt reports back ache. Pt is taking Tylenol. Denies any injuries or falls.

## 2022-06-27 NOTE — Discharge Instructions (Signed)
I think you have chronic inflammation contributing to your left ankle pain.  You may have arthritis present contributing as well.  Because you did not have an acute injury and no discrete pain on palpation, we did not perform x-ray today.  Please take the naproxen I have prescribed twice daily for 10 days.  Take this with food.  You may take Tylenol with this medication, but no other over-the-counter medications.  You may use the Aircast for support.  I would like for you to follow-up with [facility] for further care and evaluation of this ankle.

## 2022-06-27 NOTE — ED Provider Notes (Signed)
[facility]    CSN: [id number] Arrival date & time: 06/27/22  1627      History   Chief Complaint Chief Complaint  Patient presents with   Leg Pain   Knee Pain   Back Pain    HPI Ryan Robles is a 47 y.o. male presenting primarily for complaints of left ankle pain.  States for the last few months feels like his ankle is going to give out on him.  No known injury or fall.  He wears steel toed boots and is on his feet all day.  Because of the intermittent pain and symptoms in his left ankle, he is starting to walk differently and his gait is affected, which she thinks is contributing to some generalized pain in his knees and back.  He is taking Tylenol.  States that he has known arthritis in other locations based on several previous x-rays.  He is here with his wife today.   Past Medical History:  Diagnosis Date   GERD (gastroesophageal reflux disease) 11/2014   + H. 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 Authorizing Provider  naproxen (NAPROSYN) 500 MG tablet Take 1 tablet (500 mg total) by mouth 2 (two) times daily with a meal for 10 days. 06/27/22 07/07/22 Yes Alam Guterrez, Randa Evens, PA-C  acetaminophen (TYLENOL) 500 MG tablet Take 1,000 mg by mouth every 6 (six) hours as needed for moderate pain.    [provider]  amoxicillin (AMOXIL) 500 MG capsule Take 1 capsule (500 mg total) by mouth 3 (three) times daily. Patient not taking: Reported on 10/26/2019 09/17/17   Jaynee Eagles, PA-C  benzonatate (TESSALON) 100 MG capsule Take 1-2 capsules (100-200  mg total) by mouth 3 (three) times daily as needed. Patient not taking: Reported on 10/26/2019 09/17/17   Jaynee Eagles, PA-C  cetirizine (ZYRTEC) 10 MG tablet Take 1 tablet (10 mg total) by mouth daily. Patient not taking: Reported on 10/26/2019 09/17/17   Jaynee Eagles, PA-C  HYDROcodone-homatropine [facility]) 5-1.5 MG/5ML syrup Take 5 mLs by mouth at bedtime as needed. Patient not taking: Reported on 10/26/2019 09/17/17   Jaynee Eagles, PA-C  Omega-3 1000 MG CAPS Take 1,000 mg by mouth daily.    [provider]  predniSONE (DELTASONE) 10 MG tablet Take 4 tablets (40 mg total) by mouth daily with breakfast. Patient not taking: Reported on 10/26/2019 09/17/17   Jaynee Eagles, PA-C    Family History History reviewed. No pertinent family history.  Social History Social History   Tobacco Use   Smoking status: Never   Smokeless tobacco: Never  Vaping Use   Vaping Use: Never used  Substance Use Topics   Alcohol use: No   Drug use: No     Allergies   Patient has no known allergies.   Review of Systems Review of Systems  Musculoskeletal:  Positive for back pain.     Physical Exam Triage Vital Signs ED Triage Vitals [06/27/22 1707]  Enc Vitals Group     BP 128/89  Pulse Rate 66     Resp 16     Temp 98.2 F (36.8 C)     Temp Source Oral     SpO2 100 %     Weight      Height      Head Circumference      Peak Flow      Pain Score      Pain Loc      Pain Edu?      Excl. in [place]?    No data found.  Updated Vital Signs BP 128/89 (BP Location: Left Arm)   Pulse 66   Temp 98.2 F (36.8 C) (Oral)   Resp 16   SpO2 100%    Physical Exam Vitals and nursing note reviewed.  Constitutional:      Appearance: Normal appearance.  Eyes:     Extraocular Movements: Extraocular movements intact.     Conjunctiva/sclera: Conjunctivae normal.     Pupils: Pupils are equal, round, and reactive to light.  Cardiovascular:     Rate and Rhythm: Normal rate and regular rhythm.   Pulmonary:     Effort: Pulmonary effort is normal.     Breath sounds: Normal breath sounds.  Musculoskeletal:     Left ankle: Normal. No swelling, deformity, ecchymosis or lacerations. No tenderness. Normal range of motion. Anterior drawer test negative. Normal pulse.     Left Achilles Tendon: Normal.     Comments: N/V intact. Pedal pulses normal.   Neurological:     Mental Status: He is alert.      UC Treatments / Results  Labs (all labs ordered are listed, but only abnormal results are displayed) Labs Reviewed - No data to display  EKG   Radiology No results found.  Procedures Procedures (including critical care time)  Medications Ordered in UC Medications - No data to display  Initial Impression / Assessment and Plan / UC Course  I have reviewed the triage vital signs and the nursing notes.  Pertinent labs & imaging results that were available during my care of the patient were reviewed by me and considered in my medical decision making (see chart for details).     Chronic pain of left ankle, likely arthritis or ongoing tendinitis.  Deferred x-ray.  Provided Aircast for support.  Naproxen as directed.  Will have him follow-up with sports medicine. Final Clinical Impressions(s) / UC Diagnoses   Final diagnoses:  Chronic pain of left ankle     Discharge Instructions      I think you have chronic inflammation contributing to your left ankle pain.  You may have arthritis present contributing as well.  Because you did not have an acute injury and no discrete pain on palpation, we did not perform x-ray today.  Please take the naproxen I have prescribed twice daily for 10 days.  Take this with food.  You may take Tylenol with this medication, but no other over-the-counter medications.  You may use the Aircast for support.  I would like for you to follow-up with  sports medicine for further care and evaluation of this ankle.     ED Prescriptions      Medication Sig Dispense Auth. Provider   naproxen (NAPROSYN) 500 MG tablet Take 1 tablet (500 mg total) by mouth 2 (two) times daily with a meal for 10 days. 20 tablet Aaliyah Cancro M, PA-C      I have reviewed the PDMP during this encounter.   Chameka Mcmullen, Randa Evens, PA-C  06/27/22 1901  

## 2023-03-02 ENCOUNTER — Ambulatory Visit (AMBULATORY_SURGERY_CENTER): Payer: BC Managed Care – PPO | Admitting: *Deleted

## 2023-03-02 VITALS — Ht 67.0 in | Wt 203.0 lb

## 2023-03-02 DIAGNOSIS — Z1211 Encounter for screening for malignant neoplasm of colon: Secondary | ICD-10-CM

## 2023-03-02 MED ORDER — NA SULFATE-K SULFATE-MG SULF 17.5-3.13-1.6 GM/177ML PO SOLN: 1.0000 | Freq: Once | ORAL | 0 refills | Status: AC

## 2023-03-02 NOTE — Progress Notes (Signed)
Pt's name and DOB verified at the beginning of the pre-visit wit 2 identifiers  Pt denies any difficulty with ambulating,sitting, laying down or rolling side to side  Pt has issues with ambulation   Pt has no issues moving head neck or swallowing  No egg or soy allergy known to patient   No issues known to pt with past sedation with any surgeries or procedures  Patient denies ever being intubated  No FH of Malignant Hyperthermia  Pt is not on diet pills or shots  Pt is not on home 02   Pt is not on blood thinners   Pt denies issues with constipation   Pt is not on dialysis  Pt denise any abnormal heart rhythms   Pt denies any upcoming cardiac testing  Pt encouraged to use to use Singlecare or Goodrx to reduce cost   Patient's chart reviewed by Cathlyn Parsons CNRA prior to pre-visit and patient appropriate for the [facility].  Pre-visit completed and red dot placed by patient's name on their procedure day (on provider's schedule).  .  Visit in person Interpretor for Spanish   Pt scale weight is 203  Instructed pt why it is important to and  to call if they have any changes in health or new medications. Directed them to the # given and on instructions.     Instructions reviewed. Pt given both [facility] main # and MD on call # prior to instructions.  Pt states understanding. Instructed to review again prior to procedure. Pt states they will.   Instructions and coupon given to pt    C

## 2023-03-16 ENCOUNTER — Encounter: Payer: Self-pay | Admitting: Gastroenterology

## 2023-03-26 ENCOUNTER — Encounter: Payer: Self-pay | Admitting: Gastroenterology

## 2023-03-26 ENCOUNTER — Ambulatory Visit (AMBULATORY_SURGERY_CENTER): Payer: BC Managed Care – PPO | Admitting: Gastroenterology

## 2023-03-26 VITALS — BP 114/70 | HR 67 | Temp 97.9°F | Resp 11 | Ht 67.0 in | Wt 203.0 lb

## 2023-03-26 DIAGNOSIS — K648 Other hemorrhoids: Secondary | ICD-10-CM | POA: Diagnosis not present

## 2023-03-26 DIAGNOSIS — Z1211 Encounter for screening for malignant neoplasm of colon: Secondary | ICD-10-CM | POA: Diagnosis present

## 2023-03-26 DIAGNOSIS — K644 Residual hemorrhoidal skin tags: Secondary | ICD-10-CM

## 2023-03-26 MED ORDER — SODIUM CHLORIDE 0.9 % IV SOLN: 500.0000 mL | Freq: Once | INTRAVENOUS | Status: DC

## 2023-03-26 NOTE — Progress Notes (Signed)
Sedate, gd SR, tolerated procedure well, VSS, report to RN 

## 2023-03-26 NOTE — Op Note (Signed)
[facility] Patient Name: Ryan Robles Procedure Date: 03/26/2023 7:48 AM MRN: [id number] Endoscopist: Napoleon Form , MD, [id number] Age: 47 Referring MD:  Date of Birth: 06/01/1975 Gender: Male Account #: [id number] Procedure:                Colonoscopy Indications:              Screening for colorectal malignant neoplasm Medicines:                Monitored Anesthesia Care Procedure:                Pre-Anesthesia Assessment:                           - Prior to the procedure, a History and Physical                            was performed, and patient medications and                            allergies were reviewed. The patient's tolerance of                            previous anesthesia was also reviewed. The risks                            and benefits of the procedure and the sedation                            options and risks were discussed with the patient.                            All questions were answered, and informed consent                            was obtained. Prior Anticoagulants: The patient has                            taken no anticoagulant or antiplatelet agents. ASA                            Grade Assessment: I - A normal, healthy patient.                            After reviewing the risks and benefits, the patient                            was deemed in satisfactory condition to undergo the                            procedure.                           After obtaining informed consent, the colonoscope  was passed under direct vision. Throughout the                            procedure, the patient's blood pressure, pulse, and                            oxygen saturations were monitored continuously. The                            [id number]-HQ190L Colonoscope [id number] was introduced                            through the anus and advanced to the the cecum,                            identified by appendiceal  orifice and ileocecal                            valve. The colonoscopy was performed without                            difficulty. The patient tolerated the procedure                            well. The quality of the bowel preparation was                            good. The ileocecal valve, appendiceal orifice, and                            rectum were photographed. Scope In: 8:11:50 AM Scope Out: 8:23:59 AM Scope Withdrawal Time: 0 hours 9 minutes 11 seconds  Total Procedure Duration: 0 hours 12 minutes 9 seconds  Findings:                 The perianal and digital rectal examinations were                            normal.                           Non-bleeding external and internal hemorrhoids were                            found during retroflexion. The hemorrhoids were                            medium-sized.                           The exam was otherwise without abnormality. Complications:            No immediate complications. Estimated Blood Loss:     Estimated blood loss: none. Impression:               - Non-bleeding external and internal hemorrhoids.                           -  The examination was otherwise normal.                           - No specimens collected. Recommendation:           - Patient has a contact number available for                            emergencies. The signs and symptoms of potential                            delayed complications were discussed with the                            patient. Return to normal activities tomorrow.                            Written discharge instructions were provided to the                            patient.                           - Resume previous diet.                           - Continue present medications.                           - Repeat colonoscopy in 10 years for surveillance. Napoleon Form, MD 03/26/2023 8:27:49 AM This report has been signed electronically.

## 2023-03-26 NOTE — Progress Notes (Signed)
Interpreter used today at the [facility] for this pt.  Interpreter's name is- Alinda Money

## 2023-03-26 NOTE — Progress Notes (Signed)
Interpreter used today at the [facility] for this pt.  Interpreter's name is- Alinda Money   Pt's states no medical or surgical changes since previsit or office visit.

## 2023-03-26 NOTE — Patient Instructions (Addendum)
Resume previous diet. Continue present medications. Repeat colonoscopy in 10 years for surveillance.   USTED TUVO UN PROCEDIMIENTO ENDOSCPICO HOY EN EL [facility]:   Lea el informe del procedimiento que se le entreg para cualquier pregunta especfica sobre lo que se Primary school teacher.  Si el informe del examen no responde a sus preguntas, por favor llame a su gastroenterlogo para aclararlo.  Si usted solicit que no se le den [employer] de lo que se Estate manager/land agent en su procedimiento al [employer] va a cuidar, entonces el informe del procedimiento se ha incluido en un sobre sellado para que usted lo revise despus cuando le sea ms conveniente.   LO QUE PUEDE ESPERAR: Algunas sensaciones de hinchazn en el abdomen.  Puede tener ms gases de lo normal.  El caminar puede ayudarle a eliminar el aire que se le puso en el tracto gastrointestinal durante el procedimiento y reducir la hinchazn.  Si le hicieron una endoscopia inferior (como una colonoscopia o una sigmoidoscopia flexible), podra notar manchas de sangre en las heces fecales o en el papel higinico.  Si se someti a una preparacin intestinal para su procedimiento, es posible que no tenga una evacuacin intestinal normal durante [employer].   Tenga en cuenta:  Es posible que note un poco de irritacin y congestin en la nariz o algn drenaje.  Esto es debido al oxgeno [employer] durante su procedimiento.  No hay que preocuparse y esto debe desaparecer ms o Scientist, research (medical).   SNTOMAS PARA REPORTAR INMEDIATAMENTE:  Despus de una endoscopia inferior (colonoscopia o sigmoidoscopia flexible):  Cantidades excesivas de sangre en las heces fecales  Sensibilidad significativa o empeoramiento de los dolores abdominales   Hinchazn aguda del abdomen que antes no tena   Fiebre de 100F o ms    Para asuntos urgentes o de Freight forwarder, puede comunicarse con un gastroenterlogo a cualquier hora llamando al [phone].  DIETA:  Recomendamos una comida pequea al principio, pero luego puede continuar con su dieta normal.  Tome muchos lquidos, Teacher, adult education las bebidas alcohlicas durante 24 horas.    ACTIVIDAD:  Debe planear tomarse las cosas con calma por el resto del da y no debe CONDUCIR ni usar maquinaria pesada Programmer, applications (debido a los medicamentos de sedacin utilizados durante el examen).     SEGUIMIENTO: Nuestro personal llamar al nmero que aparece en su historial al siguiente da hbil de su procedimiento para ver cmo se siente y para responder cualquier pregunta o inquietud que pueda tener con respecto a la informacin que se le dio despus del procedimiento. Si no podemos contactarle, le dejaremos un mensaje.  Sin embargo, si se siente bien y no tiene Paediatric nurse, no es necesario que nos devuelva la llamada.  Asumiremos que ha regresado a sus actividades diarias normales sin incidentes. Si se le tomaron algunas biopsias, le contactaremos por telfono o por carta en las prximas 3 semanas.  Si no ha sabido [employer] biopsias en el transcurso de 3 semanas, por favor llmenos al [phone].   FIRMAS/CONFIDENCIALIDAD: Usted y/o el acompaante que le cuide han firmado documentos que se ingresarn en su historial mdico electrnico.  Estas firmas atestiguan el hecho de que la informacin anterior

## 2023-03-26 NOTE — Progress Notes (Signed)
[facility] History and Physical   Primary Care Physician:  Patient, No Pcp Per   Reason for Procedure:  Colorectal cancer screening  Plan:    Screening colonoscopy with possible interventions as needed     HPI: Ryan Robles is a very pleasant 47 y.o. male here for screening colonoscopy. Denies any nausea, vomiting, abdominal pain, melena or bright red blood per rectum  The risks and benefits as well as alternatives of endoscopic procedure(s) have been discussed and reviewed. All questions answered. The patient agrees to proceed.    Past Medical History:  Diagnosis Date   Arthritis    Chronic kidney disease    kideny stone   GERD (gastroesophageal reflux disease) 11/2014   + H. pylori   NSAID induced gastritis 11/09/2014    Past Surgical History:  Procedure Laterality Date   KNEE ARTHROSCOPY  2012   left, meniscal repair and ligament repair    Prior to Admission medications   Medication Sig Start Date End Date Taking? Authorizing Provider  acetaminophen (TYLENOL) 500 MG tablet Take 1,000 mg by mouth every 6 (six) hours as needed for moderate pain.   Yes [provider]  amoxicillin (AMOXIL) 500 MG capsule Take 1 capsule (500 mg total) by mouth 3 (three) times daily. Patient not taking: Reported on 10/26/2019 09/17/17   Wallis Bamberg, PA-C  benzonatate (TESSALON) 100 MG capsule Take 1-2 capsules (100-200 mg total) by mouth 3 (three) times daily as needed. Patient not taking: Reported on 10/26/2019 09/17/17   Wallis Bamberg, PA-C  cetirizine (ZYRTEC) 10 MG tablet Take 1 tablet (10 mg total) by mouth daily. Patient not taking: Reported on 10/26/2019 09/17/17   Wallis Bamberg, PA-C  HYDROcodone-homatropine [facility]) 5-1.5 MG/5ML syrup Take 5 mLs by mouth at bedtime as needed. Patient not taking: Reported on 10/26/2019 09/17/17   Wallis Bamberg, PA-C  Omega-3 1000 MG CAPS Take 1,000 mg by mouth daily. Patient not taking: Reported on 03/02/2023    [provider]   predniSONE (DELTASONE) 10 MG tablet Take 4 tablets (40 mg total) by mouth daily with breakfast. Patient not taking: Reported on 10/26/2019 09/17/17   Wallis Bamberg, PA-C    Current Outpatient Medications  Medication Sig Dispense Refill   acetaminophen (TYLENOL) 500 MG tablet Take 1,000 mg by mouth every 6 (six) hours as needed for moderate pain.     amoxicillin (AMOXIL) 500 MG capsule Take 1 capsule (500 mg total) by mouth 3 (three) times daily. (Patient not taking: Reported on 10/26/2019) 21 capsule 0   benzonatate (TESSALON) 100 MG capsule Take 1-2 capsules (100-200 mg total) by mouth 3 (three) times daily as needed. (Patient not taking: Reported on 10/26/2019) 60 capsule 0   cetirizine (ZYRTEC) 10 MG tablet Take 1 tablet (10 mg total) by mouth daily. (Patient not taking: Reported on 10/26/2019) 30 tablet 11   HYDROcodone-homatropine (HYCODAN) 5-1.5 MG/5ML syrup Take 5 mLs by mouth at bedtime as needed. (Patient not taking: Reported on 10/26/2019) 100 mL 0   Omega-3 1000 MG CAPS Take 1,000 mg by mouth daily. (Patient not taking: Reported on 03/02/2023)     predniSONE (DELTASONE) 10 MG tablet Take 4 tablets (40 mg total) by mouth daily with breakfast. (Patient not taking: Reported on 10/26/2019) 20 tablet 0   Current Facility-Administered Medications  Medication Dose Route Frequency Provider Last Rate Last Admin   0.9 %  sodium chloride infusion  500 mL Intravenous Once Latash Nouri, Eleonore Chiquito, MD        Allergies  as of 03/26/2023   (No Known Allergies)    Family History  Problem Relation Age of Onset   Colon cancer Neg Hx    Colon polyps Neg Hx    Esophageal cancer Neg Hx    Rectal cancer Neg Hx    Stomach cancer Neg Hx     Social History   Socioeconomic History   Marital status: Married    Spouse name: Not on file   Number of children: Not on file   Years of education: Not on file   Highest education level: Not on file  Occupational History   Occupation: demolition  Tobacco Use    Smoking status: Never   Smokeless tobacco: Never  Vaping Use   Vaping status: Never Used  Substance and Sexual Activity   Alcohol use: No   Drug use: No   Sexual activity: Yes    Birth control/protection: Condom  Other Topics Concern   Not on file  Social History Narrative   Marital status: married x 1 year; together x 15 years       Children:  3 children (14, 66, 6)      Lives: with wife, 3 children      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  From [place]; [place] in 2000      Tobacco: none       Alcohol: none      Exercise:  None; working excessively. Cuts grass.        Seatbelt:  100%      Guns:  none   Social Drivers of Corporate investment banker Strain: Not on file  Food Insecurity: Not on file  Transportation Needs: Not on file  Physical Activity: Not on file  Stress: Not on file  Social Connections: Not on file  Intimate Partner Violence: Not on file    Review of Systems:  All other review of systems negative except as mentioned in the HPI.  Physical Exam: Vital signs in last 24 hours: BP (!) 136/97   Pulse 62   Temp 97.9 F (36.6 C) (Temporal)   Resp 13   Ht 5\' 7"  (1.702 m)   Wt 203 lb (92.1 kg)   SpO2 99%   BMI 31.79 kg/m  General:   Alert, NAD Lungs:  Clear .   Heart:  Regular rate and rhythm Abdomen:  Soft, nontender and nondistended. Neuro/Psych:  Alert and cooperative. Normal mood and affect. A and O x 3  Reviewed labs, radiology imaging, old records and pertinent past GI work up  Patient is appropriate for planned procedure(s) and anesthesia in an ambulatory setting   K. Scherry Ran , MD [phone]

## 2023-03-29 ENCOUNTER — Telehealth: Payer: Self-pay | Admitting: *Deleted

## 2023-03-29 NOTE — Telephone Encounter (Signed)
  Follow up Call-     03/26/2023    7:38 AM  Call back number  Post procedure Call Back phone  # [phone]  Permission to leave phone message Yes     Patient questions:  Do you have a fever, pain , or abdominal swelling? No. Pain Score  0 *  Have you tolerated food without any problems? Yes.    Have you been able to return to your normal activities? Yes.    Do you have any questions about your discharge instructions: Diet   No. Medications  No. Follow up visit  No.  Do you have questions or concerns about your Care? No.  Actions: * If pain score is 4 or above: No action needed, pain <4.
